# Patient Record
Sex: Female | Born: 1965
Health system: Southern US, Community
[De-identification: ages and names within clinical notes are randomized; demographics above are authoritative.]

## PROBLEM LIST (undated history)

## (undated) DIAGNOSIS — F329 Major depressive disorder, single episode, unspecified: Secondary | ICD-10-CM

## (undated) DIAGNOSIS — T7840XA Allergy, unspecified, initial encounter: Secondary | ICD-10-CM

## (undated) DIAGNOSIS — E2839 Other primary ovarian failure: Secondary | ICD-10-CM

## (undated) DIAGNOSIS — E785 Hyperlipidemia, unspecified: Secondary | ICD-10-CM

## (undated) DIAGNOSIS — M8580 Other specified disorders of bone density and structure, unspecified site: Secondary | ICD-10-CM

## (undated) DIAGNOSIS — U071 COVID-19: Secondary | ICD-10-CM

## (undated) DIAGNOSIS — K635 Polyp of colon: Secondary | ICD-10-CM

## (undated) DIAGNOSIS — E7800 Pure hypercholesterolemia, unspecified: Secondary | ICD-10-CM

## (undated) DIAGNOSIS — N390 Urinary tract infection, site not specified: Secondary | ICD-10-CM

## (undated) DIAGNOSIS — E288 Other ovarian dysfunction: Secondary | ICD-10-CM

## (undated) DIAGNOSIS — F32A Depression, unspecified: Secondary | ICD-10-CM

## (undated) DIAGNOSIS — K219 Gastro-esophageal reflux disease without esophagitis: Secondary | ICD-10-CM

## (undated) DIAGNOSIS — R6882 Decreased libido: Secondary | ICD-10-CM

## (undated) HISTORY — DX: Urinary tract infection, site not specified: N39.0

## (undated) HISTORY — DX: Gastro-esophageal reflux disease without esophagitis: K21.9

## (undated) HISTORY — DX: Allergy, unspecified, initial encounter: T78.40XA

## (undated) HISTORY — DX: Decreased libido: R68.82

## (undated) HISTORY — DX: Other ovarian dysfunction: E28.8

## (undated) HISTORY — DX: Other specified disorders of bone density and structure, unspecified site: M85.80

## (undated) HISTORY — PX: TONSILLECTOMY AND ADENOIDECTOMY: SUR1326

## (undated) HISTORY — DX: COVID-19: U07.1

## (undated) HISTORY — DX: Hyperlipidemia, unspecified: E78.5

## (undated) HISTORY — DX: Other primary ovarian failure: E28.39

## (undated) HISTORY — PX: ABDOMINAL HYSTERECTOMY: SHX81

## (undated) HISTORY — DX: Polyp of colon: K63.5

## (undated) HISTORY — DX: Depression, unspecified: F32.A

## (undated) HISTORY — DX: Major depressive disorder, single episode, unspecified: F32.9

## (undated) HISTORY — DX: Pure hypercholesterolemia, unspecified: E78.00

## (undated) HISTORY — PX: CHOLECYSTECTOMY: SHX55

---

## 1997-10-23 ENCOUNTER — Other Ambulatory Visit: Admission: RE | Admit: 1997-10-23 | Discharge: 1997-10-23 | Payer: Self-pay | Admitting: Obstetrics and Gynecology

## 1998-05-15 ENCOUNTER — Inpatient Hospital Stay (HOSPITAL_COMMUNITY): Admission: AD | Admit: 1998-05-15 | Discharge: 1998-05-18 | Payer: Self-pay | Admitting: Physician Assistant

## 1998-05-21 ENCOUNTER — Encounter (HOSPITAL_COMMUNITY): Admission: RE | Admit: 1998-05-21 | Discharge: 1998-08-19 | Payer: Self-pay | Admitting: Obstetrics and Gynecology

## 1998-06-25 ENCOUNTER — Other Ambulatory Visit: Admission: RE | Admit: 1998-06-25 | Discharge: 1998-06-25 | Payer: Self-pay | Admitting: Obstetrics and Gynecology

## 1999-07-02 ENCOUNTER — Other Ambulatory Visit: Admission: RE | Admit: 1999-07-02 | Discharge: 1999-07-02 | Payer: Self-pay | Admitting: Obstetrics and Gynecology

## 2000-04-16 ENCOUNTER — Other Ambulatory Visit: Admission: RE | Admit: 2000-04-16 | Discharge: 2000-04-16 | Payer: Self-pay | Admitting: Obstetrics and Gynecology

## 2000-11-16 ENCOUNTER — Inpatient Hospital Stay (HOSPITAL_COMMUNITY): Admission: AD | Admit: 2000-11-16 | Discharge: 2000-11-18 | Payer: Self-pay | Admitting: Obstetrics and Gynecology

## 2000-11-20 ENCOUNTER — Inpatient Hospital Stay (HOSPITAL_COMMUNITY): Admission: AD | Admit: 2000-11-20 | Discharge: 2000-11-23 | Payer: Self-pay | Admitting: Obstetrics and Gynecology

## 2000-12-17 ENCOUNTER — Other Ambulatory Visit: Admission: RE | Admit: 2000-12-17 | Discharge: 2000-12-17 | Payer: Self-pay | Admitting: Obstetrics and Gynecology

## 2002-02-10 ENCOUNTER — Other Ambulatory Visit: Admission: RE | Admit: 2002-02-10 | Discharge: 2002-02-10 | Payer: Self-pay | Admitting: Obstetrics and Gynecology

## 2004-01-21 ENCOUNTER — Emergency Department: Payer: Self-pay | Admitting: Emergency Medicine

## 2004-02-05 ENCOUNTER — Ambulatory Visit: Payer: Self-pay | Admitting: Obstetrics and Gynecology

## 2008-11-01 ENCOUNTER — Ambulatory Visit: Payer: Self-pay | Admitting: Sports Medicine

## 2013-06-21 LAB — HM PAP SMEAR: HM Pap smear: NEGATIVE

## 2014-06-27 ENCOUNTER — Other Ambulatory Visit: Payer: Self-pay | Admitting: Obstetrics and Gynecology

## 2014-06-27 DIAGNOSIS — Z01419 Encounter for gynecological examination (general) (routine) without abnormal findings: Secondary | ICD-10-CM

## 2014-07-05 LAB — HM MAMMOGRAPHY

## 2015-06-28 ENCOUNTER — Encounter: Payer: Self-pay | Admitting: Obstetrics and Gynecology

## 2015-07-03 ENCOUNTER — Telehealth: Payer: Self-pay | Admitting: Obstetrics and Gynecology

## 2015-07-03 MED ORDER — ESTRADIOL 0.1 MG/24HR TD PTWK: 0.1000 mg | MEDICATED_PATCH | TRANSDERMAL | Status: DC

## 2015-07-03 NOTE — Telephone Encounter (Signed)
THIS PT MISSED HER AE AND IS OUT OF HER ESTROGEN PATCHES. SHE RESCGEDULED FOR 6/6 CAN YOU FILL THIS RX UNTIL HER APPT [place]

## 2015-07-03 NOTE — Telephone Encounter (Signed)
Pt aware per vm - med erx.  

## 2015-07-11 DIAGNOSIS — L700 Acne vulgaris: Secondary | ICD-10-CM | POA: Diagnosis not present

## 2015-08-01 ENCOUNTER — Other Ambulatory Visit: Payer: Self-pay | Admitting: Obstetrics and Gynecology

## 2015-08-07 ENCOUNTER — Telehealth: Payer: Self-pay | Admitting: *Deleted

## 2015-08-07 ENCOUNTER — Ambulatory Visit (INDEPENDENT_AMBULATORY_CARE_PROVIDER_SITE_OTHER): Payer: BLUE CROSS/BLUE SHIELD | Admitting: Obstetrics and Gynecology

## 2015-08-07 ENCOUNTER — Encounter: Payer: Self-pay | Admitting: Obstetrics and Gynecology

## 2015-08-07 VITALS — BP 121/83 | HR 61 | Ht 62.0 in | Wt 167.5 lb

## 2015-08-07 DIAGNOSIS — Z1239 Encounter for other screening for malignant neoplasm of breast: Secondary | ICD-10-CM | POA: Diagnosis not present

## 2015-08-07 DIAGNOSIS — E288 Other ovarian dysfunction: Secondary | ICD-10-CM

## 2015-08-07 DIAGNOSIS — N809 Endometriosis, unspecified: Secondary | ICD-10-CM | POA: Insufficient documentation

## 2015-08-07 DIAGNOSIS — Z90711 Acquired absence of uterus with remaining cervical stump: Secondary | ICD-10-CM | POA: Insufficient documentation

## 2015-08-07 DIAGNOSIS — Z0000 Encounter for general adult medical examination without abnormal findings: Secondary | ICD-10-CM | POA: Diagnosis not present

## 2015-08-07 DIAGNOSIS — E669 Obesity, unspecified: Secondary | ICD-10-CM

## 2015-08-07 DIAGNOSIS — E2839 Other primary ovarian failure: Secondary | ICD-10-CM | POA: Insufficient documentation

## 2015-08-07 DIAGNOSIS — Z01419 Encounter for gynecological examination (general) (routine) without abnormal findings: Secondary | ICD-10-CM

## 2015-08-07 MED ORDER — ESTRADIOL 0.1 MG/24HR TD PTTW
1.0000 | MEDICATED_PATCH | TRANSDERMAL | Status: DC
Start: 2015-08-07 — End: 2015-08-07

## 2015-08-07 MED ORDER — ESTRADIOL 0.1 MG/24HR TD PTTW: 1.0000 | MEDICATED_PATCH | TRANSDERMAL | Status: DC

## 2015-08-07 MED ORDER — ESTRADIOL 0.1 MG/24HR TD PTWK
MEDICATED_PATCH | TRANSDERMAL | Status: DC
Start: 2015-08-07 — End: 2015-08-07

## 2015-08-07 NOTE — Addendum Note (Signed)
Addended by: Elouise Munroe on: 08/07/2015 04:46 PM   Modules accepted: Orders

## 2015-08-07 NOTE — Telephone Encounter (Signed)
Pt aware meds erx to [facility].

## 2015-08-07 NOTE — Patient Instructions (Signed)
1. No Pap smear. Next Pap smear is in 2018. 2. Mammogram ordered 3. Continue with calcium and vitamin D supplementation 4. Refill Vivelle-Dot 0.1 mg twice a week 5. Continue with healthy eating, exercise, and weight loss 6. Return in 1 year for annual exam

## 2015-08-07 NOTE — Progress Notes (Signed)
GYN ANNUAL PREVENTATIVE CARE ENCOUNTER NOTE  Subjective:       Deanna Young is a 50 y.o. [phone] female here for a routine annual gynecologic exam.  Current complaints: None  History of premature ovarian failure. Status post [facility] for endometriosis. No chronic pelvic pain. Bowel and bladder function are normal. No significant vasomotor symptoms are identified. Patient is taking calcium with vitamin D daily. She also is exercising daily. She feels that her weight with a BMI of 30 minutes due to the way she eats; she is promising to help change dietary habits. Patient desires Vivelle-Dot 0.1 mg twice a week rather than generic patch because the generic patch does not stay on the skin. We will defer Pap until next year because her HPV test in 2015 was negative.   Gynecologic History No LMP recorded. Patient has had a hysterectomy. Contraception: status post hysterectomy Last Pap: 06/21/2013. Results were: Unsatisfactory/negative Last mammogram: 07/05/2014. Results were: Normal  Obstetric History OB History  Gravida Para Term Preterm AB SAB TAB Ectopic Multiple Living  2 2 2       2     # Outcome Date GA Lbr Len/2nd Weight Sex Delivery Anes PTL Lv  2 Term 2002   7 lb (3.175 kg) F Vag-Spont   Y  1 Term 2000   7 lb (3.175 kg) M Vag-Spont   Y      Past Medical History  Diagnosis Date  . Hypercholesteremia   . Depression   . Premature ovarian failure   . Decreased sex drive   . Osteopenia     Past Surgical History  Procedure Laterality Date  . Tonsillectomy and adenoidectomy    . Abdominal hysterectomy      lsh  . Cholecystectomy      Current Outpatient Prescriptions on File Prior to Visit  Medication Sig Dispense Refill  . calcium-vitamin D 250-100 MG-UNIT tablet Take 1 tablet by mouth 2 (two) times daily.    Marland Kitchen estradiol (CLIMARA - DOSED IN MG/24 HR) 0.1 mg/24hr patch PLACE 1 PATCH (0.1 MG TOTAL) ONTO THE SKIN ONCE A WEEK. 4 patch 0   No current facility-administered  medications on file prior to visit.    No Known Allergies  Social History   Social History  . Marital Status: Married    Spouse Name: N/A  . Number of Children: N/A  . Years of Education: N/A   Occupational History  . Not on file.   Social History Main Topics  . Smoking status: Never Smoker   . Smokeless tobacco: Not on file  . Alcohol Use: Yes     Comment: social  . Drug Use: No  . Sexual Activity: Yes    Birth Control/ Protection: Surgical   Other Topics Concern  . Not on file   Social History Narrative    Family History  Problem Relation Age of Onset  . Osteoporosis Mother   . Diabetes Mother   . Heart disease Maternal Grandfather   . Cancer Neg Hx     The following portions of the patient's history were reviewed and updated as appropriate: allergies, current medications, past family history, past medical history, past social history, past surgical history and problem list.  Review of Systems Review of Systems  Constitutional: Negative for chills, weight loss and malaise/fatigue.       No vasomotor symptoms of significance  HENT: Negative.   Respiratory: Negative.   Cardiovascular: Negative.   Gastrointestinal: Negative.  Negative for abdominal pain and  blood in stool.  Genitourinary: Negative.  Negative for dysuria, urgency and frequency.  Musculoskeletal: Negative.   Skin: Negative.  Negative for itching and rash.  Neurological: Negative.   Endo/Heme/Allergies: Negative.   Psychiatric/Behavioral: Negative.      Objective:   BP 121/83 mmHg  Pulse 61  Ht 5\' 2"  (1.575 m)  Wt 167 lb 8 oz (75.978 kg)  BMI 30.63 kg/m2 Physical Exam  Constitutional: She is oriented to person, place, and time. She appears well-developed and well-nourished.  HENT:  Head: Normocephalic and atraumatic.  Eyes: Conjunctivae and EOM are normal.  Neck: Normal range of motion. Neck supple. No thyromegaly present.  Cardiovascular: Normal rate, regular rhythm and normal heart  sounds.   No murmur heard. Pulmonary/Chest: Effort normal and breath sounds normal.  Abdominal: Soft. Bowel sounds are normal. She exhibits no distension and no mass. There is no tenderness.  Genitourinary:  External genitalia-normal BUS normal Vagina-normal estrogen effect; good vault support Cervix-normal; no lesions; no cervical motion tenderness Uterus-surgically absent Adnexa-nonpalpable and nontender Rectovaginal-normal external exam; normal sphincter tone; No rectal masses  Musculoskeletal: Normal range of motion. She exhibits no edema or tenderness.  Lymphadenopathy:    She has no cervical adenopathy.  Neurological: She is alert and oriented to person, place, and time.  Skin: No rash noted. No erythema.  Psychiatric: She has a normal mood and affect.    Assessment:   Annual gynecologic examination 50 y.o. Contraception: status post hysterectomy LSH Obesity 1 History of endometriosis, asymptomatic History of premature ovarian failure, asymptomatic on ERT; desires Vivelle-Dot patches not the estradiol generic patch  Plan:  Pap: Not needed Mammogram: Ordered Labs: Primary care Routine preventative health maintenance measures emphasized: Diet/Weight control, Tobacco Cessation and Alcohol/Drug use Refill Vivelle-Dot 0.1 mg patch twice a week Continue calcium and vitamin D supplementation Return to [place], [place]   Note: This dictation was prepared with Dragon dictation along with smaller phrase technology. Any transcriptional errors that result from this process are unintentional.

## 2015-08-07 NOTE — Addendum Note (Signed)
Addended by: Elouise Munroe on: 08/07/2015 04:30 PM   Modules accepted: Orders

## 2015-08-07 NOTE — Telephone Encounter (Signed)
Patient called states that Dr. Tennis Must gave her a written RX this morning and it needs to be faxed to mail order.  The RX is for Vivelle. Her mail order pharmacy is [facility]  Fax # is [phone]. Thanks

## 2015-08-16 ENCOUNTER — Ambulatory Visit: Payer: Self-pay | Attending: Obstetrics and Gynecology

## 2015-08-30 DIAGNOSIS — D225 Melanocytic nevi of trunk: Secondary | ICD-10-CM | POA: Diagnosis not present

## 2015-09-11 ENCOUNTER — Other Ambulatory Visit: Payer: Self-pay | Admitting: Obstetrics and Gynecology

## 2015-09-11 ENCOUNTER — Ambulatory Visit
Admission: RE | Admit: 2015-09-11 | Discharge: 2015-09-11 | Disposition: A | Discharge status: Home / Self Care | Payer: BLUE CROSS/BLUE SHIELD | Source: Ambulatory Visit | Attending: Obstetrics and Gynecology | Admitting: Obstetrics and Gynecology

## 2015-09-11 DIAGNOSIS — Z1239 Encounter for other screening for malignant neoplasm of breast: Secondary | ICD-10-CM

## 2015-09-11 DIAGNOSIS — Z1231 Encounter for screening mammogram for malignant neoplasm of breast: Secondary | ICD-10-CM | POA: Diagnosis not present

## 2015-11-08 DIAGNOSIS — N951 Menopausal and female climacteric states: Secondary | ICD-10-CM | POA: Diagnosis not present

## 2015-11-08 DIAGNOSIS — E782 Mixed hyperlipidemia: Secondary | ICD-10-CM | POA: Diagnosis not present

## 2015-11-08 DIAGNOSIS — E559 Vitamin D deficiency, unspecified: Secondary | ICD-10-CM | POA: Diagnosis not present

## 2015-11-08 DIAGNOSIS — Z6830 Body mass index (BMI) 30.0-30.9, adult: Secondary | ICD-10-CM | POA: Diagnosis not present

## 2015-11-08 DIAGNOSIS — Z0000 Encounter for general adult medical examination without abnormal findings: Secondary | ICD-10-CM | POA: Diagnosis not present

## 2015-12-11 DIAGNOSIS — M9902 Segmental and somatic dysfunction of thoracic region: Secondary | ICD-10-CM | POA: Diagnosis not present

## 2015-12-11 DIAGNOSIS — M531 Cervicobrachial syndrome: Secondary | ICD-10-CM | POA: Diagnosis not present

## 2015-12-11 DIAGNOSIS — M5386 Other specified dorsopathies, lumbar region: Secondary | ICD-10-CM | POA: Diagnosis not present

## 2016-01-21 DIAGNOSIS — M9901 Segmental and somatic dysfunction of cervical region: Secondary | ICD-10-CM | POA: Diagnosis not present

## 2016-01-21 DIAGNOSIS — M5386 Other specified dorsopathies, lumbar region: Secondary | ICD-10-CM | POA: Diagnosis not present

## 2016-01-21 DIAGNOSIS — M531 Cervicobrachial syndrome: Secondary | ICD-10-CM | POA: Diagnosis not present

## 2016-01-21 DIAGNOSIS — M9902 Segmental and somatic dysfunction of thoracic region: Secondary | ICD-10-CM | POA: Diagnosis not present

## 2016-03-04 DIAGNOSIS — M9901 Segmental and somatic dysfunction of cervical region: Secondary | ICD-10-CM | POA: Diagnosis not present

## 2016-03-04 DIAGNOSIS — M531 Cervicobrachial syndrome: Secondary | ICD-10-CM | POA: Diagnosis not present

## 2016-03-04 DIAGNOSIS — M436 Torticollis: Secondary | ICD-10-CM | POA: Diagnosis not present

## 2016-03-04 DIAGNOSIS — Z23 Encounter for immunization: Secondary | ICD-10-CM | POA: Diagnosis not present

## 2016-03-17 DIAGNOSIS — M531 Cervicobrachial syndrome: Secondary | ICD-10-CM | POA: Diagnosis not present

## 2016-03-17 DIAGNOSIS — M436 Torticollis: Secondary | ICD-10-CM | POA: Diagnosis not present

## 2016-03-17 DIAGNOSIS — M9901 Segmental and somatic dysfunction of cervical region: Secondary | ICD-10-CM | POA: Diagnosis not present

## 2016-05-27 DIAGNOSIS — M9901 Segmental and somatic dysfunction of cervical region: Secondary | ICD-10-CM | POA: Diagnosis not present

## 2016-05-27 DIAGNOSIS — M9903 Segmental and somatic dysfunction of lumbar region: Secondary | ICD-10-CM | POA: Diagnosis not present

## 2016-05-27 DIAGNOSIS — M531 Cervicobrachial syndrome: Secondary | ICD-10-CM | POA: Diagnosis not present

## 2016-05-27 DIAGNOSIS — M5386 Other specified dorsopathies, lumbar region: Secondary | ICD-10-CM | POA: Diagnosis not present

## 2016-06-20 DIAGNOSIS — G5721 Lesion of femoral nerve, right lower limb: Secondary | ICD-10-CM | POA: Diagnosis not present

## 2016-06-20 DIAGNOSIS — S39012A Strain of muscle, fascia and tendon of lower back, initial encounter: Secondary | ICD-10-CM | POA: Diagnosis not present

## 2016-06-20 DIAGNOSIS — M9903 Segmental and somatic dysfunction of lumbar region: Secondary | ICD-10-CM | POA: Diagnosis not present

## 2016-06-23 DIAGNOSIS — M9903 Segmental and somatic dysfunction of lumbar region: Secondary | ICD-10-CM | POA: Diagnosis not present

## 2016-06-23 DIAGNOSIS — S39012A Strain of muscle, fascia and tendon of lower back, initial encounter: Secondary | ICD-10-CM | POA: Diagnosis not present

## 2016-06-23 DIAGNOSIS — G5721 Lesion of femoral nerve, right lower limb: Secondary | ICD-10-CM | POA: Diagnosis not present

## 2016-06-24 DIAGNOSIS — M9903 Segmental and somatic dysfunction of lumbar region: Secondary | ICD-10-CM | POA: Diagnosis not present

## 2016-06-24 DIAGNOSIS — G5721 Lesion of femoral nerve, right lower limb: Secondary | ICD-10-CM | POA: Diagnosis not present

## 2016-06-24 DIAGNOSIS — S39012A Strain of muscle, fascia and tendon of lower back, initial encounter: Secondary | ICD-10-CM | POA: Diagnosis not present

## 2016-06-27 DIAGNOSIS — M9903 Segmental and somatic dysfunction of lumbar region: Secondary | ICD-10-CM | POA: Diagnosis not present

## 2016-06-27 DIAGNOSIS — S39012A Strain of muscle, fascia and tendon of lower back, initial encounter: Secondary | ICD-10-CM | POA: Diagnosis not present

## 2016-06-27 DIAGNOSIS — G5721 Lesion of femoral nerve, right lower limb: Secondary | ICD-10-CM | POA: Diagnosis not present

## 2016-07-24 ENCOUNTER — Telehealth: Payer: Self-pay | Admitting: Obstetrics and Gynecology

## 2016-07-24 NOTE — Telephone Encounter (Signed)
VM from [place] concerning the refill request for estradiol patch  Please call [phone] or fax/escribe to [phone]

## 2016-07-25 NOTE — Telephone Encounter (Signed)
Pharmacy aware of refill request.

## 2016-08-04 NOTE — Progress Notes (Signed)
GYN ANNUAL PREVENTATIVE CARE ENCOUNTER NOTE  Subjective:       Deanna Young is a 51 y.o. [phone] female here for a routine annual gynecologic exam.  Current complaints: None 1. none  History of premature ovarian failure. Status post [facility] for endometriosis. No chronic pelvic pain. Bowel and bladder function are normal. No significant vasomotor symptoms are identified.She remains on Vivelle-Dot 0.1 mg twice weekly Patient is taking calcium with vitamin D daily. She also is exercising daily.  The patient's primary care doctor is retiring: She will use me as  her primary care physician at this time.   Gynecologic History No LMP recorded. Patient has had a hysterectomy. Contraception: status post hysterectomy Last Pap: 06/21/2013. Results were: Unsatisfactory/negative Last mammogram 09/2015 birad 1  Results were: Normal  Obstetric History OB History  Gravida Para Term Preterm AB Living  2 2 2     2   SAB TAB Ectopic Multiple Live Births          2    # Outcome Date GA Lbr Len/2nd Weight Sex Delivery Anes PTL Lv  2 Term 2002   7 lb (3.175 kg) F Vag-Spont   LIV  1 Term 2000   7 lb (3.175 kg) M Vag-Spont   LIV      Past Medical History:  Diagnosis Date  . Decreased sex drive   . Depression   . Hypercholesteremia   . Osteopenia   . Premature ovarian failure     Past Surgical History:  Procedure Laterality Date  . ABDOMINAL HYSTERECTOMY     lsh  . CHOLECYSTECTOMY    . TONSILLECTOMY AND ADENOIDECTOMY      Current Outpatient Prescriptions on File Prior to Visit  Medication Sig Dispense Refill  . ACZONE 7.5 % GEL Aply TO FACE EVERY MORNING  0  . calcium-vitamin D 250-100 MG-UNIT tablet Take 1 tablet by mouth 2 (two) times daily.    Marland Kitchen estradiol (VIVELLE-DOT) 0.1 MG/24HR patch Place 1 patch (0.1 mg total) onto the skin 2 (two) times a week. 24 patch 3  . hydrochlorothiazide (HYDRODIURIL) 25 MG tablet   4  . VELTIN gel APPLY TO THE AFFECTED AREA AT BEDTIME  0   No current  facility-administered medications on file prior to visit.     No Known Allergies  Social History   Social History  . Marital status: Married    Spouse name: N/A  . Number of children: N/A  . Years of education: N/A   Occupational History  . Not on file.   Social History Main Topics  . Smoking status: Never Smoker  . Smokeless tobacco: Not on file  . Alcohol use Yes     Comment: social  . Drug use: No  . Sexual activity: Yes    Birth control/ protection: Surgical   Other Topics Concern  . Not on file   Social History Narrative  . No narrative on file    Family History  Problem Relation Age of Onset  . Osteoporosis Mother   . Diabetes Mother   . Heart disease Maternal Grandfather   . Cancer Neg Hx     The following portions of the patient's history were reviewed and updated as appropriate: allergies, current medications, past family history, past medical history, past social history, past surgical history and problem list.  Review of Systems Review of Systems  Constitutional: Positive for weight loss. Negative for chills and malaise/fatigue.  HENT: Negative.   Eyes: Negative.   Respiratory:  Negative.   Cardiovascular: Negative.   Gastrointestinal: Negative.  Negative for abdominal pain and blood in stool.  Genitourinary: Negative.  Negative for dysuria, frequency and urgency.  Musculoskeletal: Negative.   Skin: Negative.  Negative for itching and rash.  Neurological: Negative.   Endo/Heme/Allergies: Negative.   Psychiatric/Behavioral: Negative.      Objective:  BP 107/71   Pulse 63   Ht 5\' 2"  (1.575 m)   Wt 163 lb 6.4 oz (74.1 kg)   BMI 29.89 kg/m   Physical Exam  Constitutional: She is oriented to person, place, and time. She appears well-developed and well-nourished.  HENT:  Head: Normocephalic and atraumatic.  Eyes: Conjunctivae and EOM are normal. No scleral icterus.  Neck: Normal range of motion. Neck supple. No thyromegaly present.   Cardiovascular: Normal rate, regular rhythm and normal heart sounds.   No murmur heard. Pulmonary/Chest: Effort normal and breath sounds normal.  Abdominal: Soft. Bowel sounds are normal. She exhibits no distension and no mass. There is no tenderness.  Genitourinary:  Genitourinary Comments: External genitalia-normal BUS normal Vagina-normal estrogen effect; good vault support Cervix-normal; no lesions; no cervical motion tenderness Uterus-surgically absent Adnexa-nonpalpable and nontender Rectovaginal-normal external exam; normal sphincter tone; No rectal masses  Musculoskeletal: Normal range of motion. She exhibits no edema or tenderness.  Lymphadenopathy:    She has no cervical adenopathy.  Neurological: She is alert and oriented to person, place, and time.  Skin: No rash noted. No erythema.  Psychiatric: She has a normal mood and affect.    Assessment:   Annual gynecologic examination 51 y.o. Contraception: status post hysterectomy LSH Obesity 1 History of endometriosis, asymptomatic History of premature ovarian failure, asymptomatic on ERT  Plan:  Pap: pap w/hpv Mammogram: Ordered Labs: Lipid 1, FBS, TSH and Hemoglobin A1C Routine preventative health maintenance measures emphasized: Diet/Weight control, Tobacco Cessation and Alcohol/Drug use Refill Vivelle-Dot 0.1 mg patch twice a week Continue calcium and vitamin D supplementation Return to [place], CMA  Brayton Mars, MD   Note: This dictation was prepared with Dragon dictation along with smaller phrase technology. Any transcriptional errors that result from this process are unintentional.

## 2016-08-07 ENCOUNTER — Encounter: Payer: BLUE CROSS/BLUE SHIELD | Admitting: Obstetrics and Gynecology

## 2016-08-07 ENCOUNTER — Ambulatory Visit (INDEPENDENT_AMBULATORY_CARE_PROVIDER_SITE_OTHER): Payer: BLUE CROSS/BLUE SHIELD | Admitting: Obstetrics and Gynecology

## 2016-08-07 ENCOUNTER — Encounter: Payer: Self-pay | Admitting: Obstetrics and Gynecology

## 2016-08-07 VITALS — BP 107/71 | HR 63 | Ht 62.0 in | Wt 163.4 lb

## 2016-08-07 DIAGNOSIS — Z01419 Encounter for gynecological examination (general) (routine) without abnormal findings: Secondary | ICD-10-CM

## 2016-08-07 DIAGNOSIS — E288 Other ovarian dysfunction: Secondary | ICD-10-CM | POA: Diagnosis not present

## 2016-08-07 DIAGNOSIS — Z1231 Encounter for screening mammogram for malignant neoplasm of breast: Secondary | ICD-10-CM

## 2016-08-07 DIAGNOSIS — N809 Endometriosis, unspecified: Secondary | ICD-10-CM | POA: Diagnosis not present

## 2016-08-07 DIAGNOSIS — Z1239 Encounter for other screening for malignant neoplasm of breast: Secondary | ICD-10-CM

## 2016-08-07 DIAGNOSIS — E2839 Other primary ovarian failure: Secondary | ICD-10-CM

## 2016-08-07 DIAGNOSIS — E669 Obesity, unspecified: Secondary | ICD-10-CM | POA: Diagnosis not present

## 2016-08-07 DIAGNOSIS — Z90711 Acquired absence of uterus with remaining cervical stump: Secondary | ICD-10-CM

## 2016-08-07 MED ORDER — ESTRADIOL 0.1 MG/24HR TD PTTW: 1.0000 | MEDICATED_PATCH | TRANSDERMAL | 3 refills | Status: DC

## 2016-08-07 NOTE — Patient Instructions (Signed)
1. Pap smear is done 2. Mammogram is ordered 3. Screening labs are to be obtained through primary care 4. Vivelle-Dot 0.1 mg twice weekly is refilled 5. Continue with calcium and vitamin D supplementation daily 6. Continue with healthy eating and exercise 7. Return in 1 year for annual exam  Health Maintenance, Female Adopting a healthy lifestyle and getting preventive care can go a long way to promote health and wellness. Talk with your health care provider about what schedule of regular examinations is right for you. This is a good chance for you to check in with your provider about disease prevention and staying healthy. In between checkups, there are plenty of things you can do on your own. Experts have done a lot of research about which lifestyle changes and preventive measures are most likely to keep you healthy. Ask your health care provider for more information. Weight and diet Eat a healthy diet  Be sure to include plenty of vegetables, fruits, low-fat dairy products, and lean protein.  Do not eat a lot of foods high in solid fats, added sugars, or salt.  Get regular exercise. This is one of the most important things you can do for your health. ? Most adults should exercise for at least 150 minutes each week. The exercise should increase your heart rate and make you sweat (moderate-intensity exercise). ? Most adults should also do strengthening exercises at least twice a week. This is in addition to the moderate-intensity exercise.  Maintain a healthy weight  Body mass index (BMI) is a measurement that can be used to identify possible weight problems. It estimates body fat based on height and weight. Your health care provider can help determine your BMI and help you achieve or maintain a healthy weight.  For females 61 years of age and older: ? A BMI below 18.5 is considered underweight. ? A BMI of 18.5 to 24.9 is normal. ? A BMI of 25 to 29.9 is considered overweight. ? A BMI  of 30 and above is considered obese.  Watch levels of cholesterol and blood lipids  You should start having your blood tested for lipids and cholesterol at 49 years of age, then have this test every 5 years.  You may need to have your cholesterol levels checked more often if: ? Your lipid or cholesterol levels are high. ? You are older than 49 years of age. ? You are at high risk for heart disease.  Cancer screening Lung Cancer  Lung cancer screening is recommended for adults 29-36 years old who are at high risk for lung cancer because of a history of smoking.  A yearly low-dose CT scan of the lungs is recommended for people who: ? Currently smoke. ? Have quit within the past 15 years. ? Have at least a 30-pack-year history of smoking. A pack year is smoking an average of one pack of cigarettes a day for 1 year.  Yearly screening should continue until it has been 15 years since you quit.  Yearly screening should stop if you develop a health problem that would prevent you from having lung cancer treatment.  Breast Cancer  Practice breast self-awareness. This means understanding how your breasts normally appear and feel.  It also means doing regular breast self-exams. Let your health care provider know about any changes, no matter how small.  If you are in your 20s or 30s, you should have a clinical breast exam (CBE) by a health care provider every 1-3 years as part  of a regular health exam.  If you are 30 or older, have a CBE every year. Also consider having a breast X-ray (mammogram) every year.  If you have a family history of breast cancer, talk to your health care provider about genetic screening.  If you are at high risk for breast cancer, talk to your health care provider about having an MRI and a mammogram every year.  Breast cancer gene (BRCA) assessment is recommended for women who have family members with BRCA-related cancers. BRCA-related cancers  include: ? Breast. ? Ovarian. ? Tubal. ? Peritoneal cancers.  Results of the assessment will determine the need for genetic counseling and BRCA1 and BRCA2 testing.  Cervical Cancer Your health care provider may recommend that you be screened regularly for cancer of the pelvic organs (ovaries, uterus, and vagina). This screening involves a pelvic examination, including checking for microscopic changes to the surface of your cervix (Pap test). You may be encouraged to have this screening done every 3 years, beginning at age 70.  For women ages 17-65, health care providers may recommend pelvic exams and Pap testing every 3 years, or they may recommend the Pap and pelvic exam, combined with testing for human papilloma virus (HPV), every 5 years. Some types of HPV increase your risk of cervical cancer. Testing for HPV may also be done on women of any age with unclear Pap test results.  Other health care providers may not recommend any screening for nonpregnant women who are considered low risk for pelvic cancer and who do not have symptoms. Ask your health care provider if a screening pelvic exam is right for you.  If you have had past treatment for cervical cancer or a condition that could lead to cancer, you need Pap tests and screening for cancer for at least 20 years after your treatment. If Pap tests have been discontinued, your risk factors (such as having a new sexual partner) need to be reassessed to determine if screening should resume. Some women have medical problems that increase the chance of getting cervical cancer. In these cases, your health care provider may recommend more frequent screening and Pap tests.  Colorectal Cancer  This type of cancer can be detected and often prevented.  Routine colorectal cancer screening usually begins at 49 years of age and continues through 49 years of age.  Your health care provider may recommend screening at an earlier age if you have risk factors  for colon cancer.  Your health care provider may also recommend using home test kits to check for hidden blood in the stool.  A small camera at the end of a tube can be used to examine your colon directly (sigmoidoscopy or colonoscopy). This is done to check for the earliest forms of colorectal cancer.  Routine screening usually begins at age 85.  Direct examination of the colon should be repeated every 5-10 years through 49 years of age. However, you may need to be screened more often if early forms of precancerous polyps or small growths are found.  Skin Cancer  Check your skin from head to toe regularly.  Tell your health care provider about any new moles or changes in moles, especially if there is a change in a mole's shape or color.  Also tell your health care provider if you have a mole that is larger than the size of a pencil eraser.  Always use sunscreen. Apply sunscreen liberally and repeatedly throughout the day.  Protect yourself by wearing long  sleeves, pants, a wide-brimmed hat, and sunglasses whenever you are outside.  Heart disease, diabetes, and high blood pressure  High blood pressure causes heart disease and increases the risk of stroke. High blood pressure is more likely to develop in: ? People who have blood pressure in the high end of the normal range (130-139/85-89 mm Hg). ? People who are overweight or obese. ? People who are African American.  If you are 93-79 years of age, have your blood pressure checked every 3-5 years. If you are 85 years of age or older, have your blood pressure checked every year. You should have your blood pressure measured twice-once when you are at a hospital or clinic, and once when you are not at a hospital or clinic. Record the average of the two measurements. To check your blood pressure when you are not at a hospital or clinic, you can use: ? An automated blood pressure machine at a pharmacy. ? A home blood pressure monitor.  If  you are between 74 years and 26 years old, ask your health care provider if you should take aspirin to prevent strokes.  Have regular diabetes screenings. This involves taking a blood sample to check your fasting blood sugar level. ? If you are at a normal weight and have a low risk for diabetes, have this test once every three years after 49 years of age. ? If you are overweight and have a high risk for diabetes, consider being tested at a younger age or more often. Preventing infection Hepatitis B  If you have a higher risk for hepatitis B, you should be screened for this virus. You are considered at high risk for hepatitis B if: ? You were born in a country where hepatitis B is common. Ask your health care provider which countries are considered high risk. ? Your parents were born in a high-risk country, and you have not been immunized against hepatitis B (hepatitis B vaccine). ? You have HIV or AIDS. ? You use needles to inject street drugs. ? You live with someone who has hepatitis B. ? You have had sex with someone who has hepatitis B. ? You get hemodialysis treatment. ? You take certain medicines for conditions, including cancer, organ transplantation, and autoimmune conditions.  Hepatitis C  Blood testing is recommended for: ? Everyone born from [birth year] through 1963. ? Anyone with known risk factors for hepatitis C.  Sexually transmitted infections (STIs)  You should be screened for sexually transmitted infections (STIs) including gonorrhea and chlamydia if: ? You are sexually active and are younger than 49 years of age. ? You are older than 49 years of age and your health care provider tells you that you are at risk for this type of infection. ? Your sexual activity has changed since you were last screened and you are at an increased risk for chlamydia or gonorrhea. Ask your health care provider if you are at risk.  If you do not have HIV, but are at risk, it may be recommended  that you take a prescription medicine daily to prevent HIV infection. This is called pre-exposure prophylaxis (PrEP). You are considered at risk if: ? You are sexually active and do not regularly use condoms or know the HIV status of your partner(s). ? You take drugs by injection. ? You are sexually active with a partner who has HIV.  Talk with your health care provider about whether you are at high risk of being infected with HIV. If  you choose to begin PrEP, you should first be tested for HIV. You should then be tested every 3 months for as long as you are taking PrEP. Pregnancy  If you are premenopausal and you may become pregnant, ask your health care provider about preconception counseling.  If you may become pregnant, take 400 to 800 micrograms (mcg) of folic acid every day.  If you want to prevent pregnancy, talk to your health care provider about birth control (contraception). Osteoporosis and menopause  Osteoporosis is a disease in which the bones lose minerals and strength with aging. This can result in serious bone fractures. Your risk for osteoporosis can be identified using a bone density scan.  If you are 51 years of age or older, or if you are at risk for osteoporosis and fractures, ask your health care provider if you should be screened.  Ask your health care provider whether you should take a calcium or vitamin D supplement to lower your risk for osteoporosis.  Menopause may have certain physical symptoms and risks.  Hormone replacement therapy may reduce some of these symptoms and risks. Talk to your health care provider about whether hormone replacement therapy is right for you. Follow these instructions at home:  Schedule regular health, dental, and eye exams.  Stay current with your immunizations.  Do not use any tobacco products including cigarettes, chewing tobacco, or electronic cigarettes.  If you are pregnant, do not drink alcohol.  If you are  breastfeeding, limit how much and how often you drink alcohol.  Limit alcohol intake to no more than 1 drink per day for nonpregnant women. One drink equals 12 ounces of beer, 5 ounces of wine, or 1 ounces of hard liquor.  Do not use street drugs.  Do not share needles.  Ask your health care provider for help if you need support or information about quitting drugs.  Tell your health care provider if you often feel depressed.  Tell your health care provider if you have ever been abused or do not feel safe at home. This information is not intended to replace advice given to you by your health care provider. Make sure you discuss any questions you have with your health care provider. Document Released: 09/02/2010 Document Revised: 07/26/2015 Document Reviewed: 11/21/2014 Elsevier Interactive Patient Education  [employer].

## 2016-08-08 LAB — LIPID PANEL
CHOLESTEROL TOTAL: 220 mg/dL — AB (ref 100–199)
Chol/HDL Ratio: 3 ratio (ref 0.0–4.4)
HDL: 74 mg/dL (ref >39)
LDL CALC: 131 mg/dL — AB (ref 0–99)
TRIGLYCERIDES: 73 mg/dL (ref 0–149)
VLDL CHOLESTEROL CAL: 15 mg/dL (ref 5–40)

## 2016-08-08 LAB — HEMOGLOBIN A1C
ESTIMATED AVERAGE GLUCOSE: 91 mg/dL
Hgb A1c MFr Bld: 4.8 % (ref 4.8–5.6)

## 2016-08-08 LAB — TSH: TSH: 1.77 u[IU]/mL (ref 0.450–4.500)

## 2016-08-08 LAB — GLUCOSE, FASTING: Glucose, Plasma: 83 mg/dL (ref 65–99)

## 2016-08-09 LAB — PAP IG AND HPV HIGH-RISK
HPV, high-risk: NEGATIVE
PAP Smear Comment: 0

## 2016-08-29 DIAGNOSIS — D225 Melanocytic nevi of trunk: Secondary | ICD-10-CM | POA: Diagnosis not present

## 2016-10-07 ENCOUNTER — Ambulatory Visit
Admission: RE | Admit: 2016-10-07 | Discharge: 2016-10-07 | Disposition: A | Discharge status: Home / Self Care | Payer: BLUE CROSS/BLUE SHIELD | Source: Ambulatory Visit | Attending: Obstetrics and Gynecology | Admitting: Obstetrics and Gynecology

## 2016-10-07 DIAGNOSIS — Z1231 Encounter for screening mammogram for malignant neoplasm of breast: Secondary | ICD-10-CM | POA: Diagnosis not present

## 2016-10-07 DIAGNOSIS — Z1239 Encounter for other screening for malignant neoplasm of breast: Secondary | ICD-10-CM

## 2017-01-14 DIAGNOSIS — Z23 Encounter for immunization: Secondary | ICD-10-CM | POA: Diagnosis not present

## 2017-03-03 DIAGNOSIS — K5790 Diverticulosis of intestine, part unspecified, without perforation or abscess without bleeding: Secondary | ICD-10-CM

## 2017-03-03 HISTORY — DX: Diverticulosis of intestine, part unspecified, without perforation or abscess without bleeding: K57.90

## 2017-03-18 DIAGNOSIS — M9902 Segmental and somatic dysfunction of thoracic region: Secondary | ICD-10-CM | POA: Diagnosis not present

## 2017-03-18 DIAGNOSIS — M9901 Segmental and somatic dysfunction of cervical region: Secondary | ICD-10-CM | POA: Diagnosis not present

## 2017-03-18 DIAGNOSIS — M531 Cervicobrachial syndrome: Secondary | ICD-10-CM | POA: Diagnosis not present

## 2017-03-27 DIAGNOSIS — M9903 Segmental and somatic dysfunction of lumbar region: Secondary | ICD-10-CM | POA: Diagnosis not present

## 2017-03-27 DIAGNOSIS — M9902 Segmental and somatic dysfunction of thoracic region: Secondary | ICD-10-CM | POA: Diagnosis not present

## 2017-03-27 DIAGNOSIS — M531 Cervicobrachial syndrome: Secondary | ICD-10-CM | POA: Diagnosis not present

## 2017-03-27 DIAGNOSIS — M9901 Segmental and somatic dysfunction of cervical region: Secondary | ICD-10-CM | POA: Diagnosis not present

## 2017-05-27 DIAGNOSIS — M9902 Segmental and somatic dysfunction of thoracic region: Secondary | ICD-10-CM | POA: Diagnosis not present

## 2017-05-27 DIAGNOSIS — M9903 Segmental and somatic dysfunction of lumbar region: Secondary | ICD-10-CM | POA: Diagnosis not present

## 2017-05-27 DIAGNOSIS — M9901 Segmental and somatic dysfunction of cervical region: Secondary | ICD-10-CM | POA: Diagnosis not present

## 2017-08-10 NOTE — Progress Notes (Signed)
GYN ANNUAL PREVENTATIVE CARE ENCOUNTER NOTE  Subjective:       Deanna Young is a 52 y.o. [phone] female here for a routine annual gynecologic exam.  Current complaints: None 1. none  History of premature ovarian failure. Status post [facility] for endometriosis. No chronic pelvic pain. Bowel and bladder function are normal. No significant vasomotor symptoms are identified except for occasional hot flash..She remains on Vivelle-Dot 0.1 mg twice weekly.  Patient is taking calcium with vitamin D daily. She also is exercising daily-playing tennis and working The patient's primary care doctor has retired.  Gynecologic History No LMP recorded. Patient has had a hysterectomy. Contraception: status post hysterectomy Last Pap: 08/07/2016 neg/neg. Results were: Unsatisfactory/negative Last mammogram 10/2016  birad 1  Results were: Normal  Obstetric History OB History  Gravida Para Term Preterm AB Living  2 2 2     2   SAB TAB Ectopic Multiple Live Births          2    # Outcome Date GA Lbr Len/2nd Weight Sex Delivery Anes PTL Lv  2 Term 2002   7 lb (3.175 kg) F Vag-Spont   LIV  1 Term 2000   7 lb (3.175 kg) M Vag-Spont   LIV    Past Medical History:  Diagnosis Date  . Decreased sex drive   . Depression   . Hypercholesteremia   . Osteopenia   . Premature ovarian failure     Past Surgical History:  Procedure Laterality Date  . ABDOMINAL HYSTERECTOMY     lsh  . CHOLECYSTECTOMY    . TONSILLECTOMY AND ADENOIDECTOMY      Current Outpatient Medications on File Prior to Visit  Medication Sig Dispense Refill  . calcium-vitamin D 250-100 MG-UNIT tablet Take 1 tablet by mouth 2 (two) times daily.    Marland Kitchen estradiol (VIVELLE-DOT) 0.1 MG/24HR patch Place 1 patch (0.1 mg total) onto the skin 2 (two) times a week. 24 patch 3  . hydrochlorothiazide (HYDRODIURIL) 25 MG tablet   4   No current facility-administered medications on file prior to visit.     No Known Allergies  Social History    Socioeconomic History  . Marital status: Married    Spouse name: Not on file  . Number of children: Not on file  . Years of education: Not on file  . Highest education level: Not on file  Occupational History  . Not on file  Social Needs  . Financial resource strain: Not on file  . Food insecurity:    Worry: Not on file    Inability: Not on file  . Transportation needs:    Medical: Not on file    Non-medical: Not on file  Tobacco Use  . Smoking status: Never Smoker  . Smokeless tobacco: Never Used  Substance and Sexual Activity  . Alcohol use: Yes    Comment: social  . Drug use: No  . Sexual activity: Yes    Birth control/protection: Surgical  Lifestyle  . Physical activity:    Days per week: Not on file    Minutes per session: Not on file  . Stress: Not on file  Relationships  . Social connections:    Talks on phone: Not on file    Gets together: Not on file    Attends religious service: Not on file    Active member of club or organization: Not on file    Attends meetings of clubs or organizations: Not on file    Relationship  status: Not on file  . Intimate partner violence:    Fear of current or ex partner: Not on file    Emotionally abused: Not on file    Physically abused: Not on file    Forced sexual activity: Not on file  Other Topics Concern  . Not on file  Social History Narrative  . Not on file    Family History  Problem Relation Age of Onset  . Osteoporosis Mother   . Diabetes Mother   . Heart disease Maternal Grandfather   . Cancer Neg Hx     The following portions of the patient's history were reviewed and updated as appropriate: allergies, current medications, past family history, past medical history, past social history, past surgical history and problem list.  Review of Systems Review of Systems  Constitutional:       Occasional hot flash  HENT: Negative.   Eyes: Negative.   Respiratory: Negative.   Cardiovascular: Negative.    Gastrointestinal: Negative.   Genitourinary: Negative.        No incontinence  Musculoskeletal: Negative.   Neurological: Negative.   Endo/Heme/Allergies: Negative.   Psychiatric/Behavioral: Negative.       Objective:  BP 122/71   Pulse 66   Ht 5\' 2"  (1.575 m)   Wt 165 lb 8 oz (75.1 kg)   BMI 30.27 kg/m  Pleasant well-appearing female no acute distress.  Alert and oriented. HEENT exam: Normocephalic atraumatic.  Oropharynx clear. Neck: Supple without thyromegaly adenopathy Lungs: Clear Heart: Regular rate and rhythm without murmur Back: No CVA tenderness or spinal tenderness Abdomen: Soft, nontender; no organomegaly Pelvic exam: Surgeon today-normal BUS-normal Vagina-good estrogen effect; no significant discharge Cervix-HEENT exam: Normocephalic atraumatic Uterus-surgically absent Adnexa-nonpalpable nontender Rectovaginal-normal external exam; normal sphincter tone; no rectal masses Extremities: Warm and dry without edema Skin: No rash or atypical lesions   Assessment:   Annual gynecologic examination 52 y.o. Contraception: status post hysterectomy LSH Obesity 1 BMI 30.27 History of endometriosis, asymptomatic History of premature ovarian failure, asymptomatic on ERT  Plan:  Pap: Due 2021 Mammogram: Ordered Labs: Lipid 1, FBS, TSH and Hemoglobin A1C Routine preventative health maintenance measures emphasized: Diet/Weight control, Tobacco Cessation and Alcohol/Drug use Refill Vivelle-Dot 0.1 mg patch twice a week Continue calcium and vitamin D supplementation Return to [place], CMA  Brayton Mars, MD   Note: This dictation was prepared with Dragon dictation along with smaller phrase technology. Any transcriptional errors that result from this process are unintentional.

## 2017-08-19 ENCOUNTER — Encounter: Payer: Self-pay | Admitting: Obstetrics and Gynecology

## 2017-08-19 ENCOUNTER — Ambulatory Visit (INDEPENDENT_AMBULATORY_CARE_PROVIDER_SITE_OTHER): Payer: BLUE CROSS/BLUE SHIELD | Admitting: Obstetrics and Gynecology

## 2017-08-19 VITALS — BP 122/71 | HR 66 | Ht 62.0 in | Wt 165.5 lb

## 2017-08-19 DIAGNOSIS — Z90711 Acquired absence of uterus with remaining cervical stump: Secondary | ICD-10-CM

## 2017-08-19 DIAGNOSIS — Z01419 Encounter for gynecological examination (general) (routine) without abnormal findings: Secondary | ICD-10-CM

## 2017-08-19 DIAGNOSIS — Z1239 Encounter for other screening for malignant neoplasm of breast: Secondary | ICD-10-CM

## 2017-08-19 DIAGNOSIS — E669 Obesity, unspecified: Secondary | ICD-10-CM | POA: Diagnosis not present

## 2017-08-19 DIAGNOSIS — E288 Other ovarian dysfunction: Secondary | ICD-10-CM | POA: Diagnosis not present

## 2017-08-19 DIAGNOSIS — N809 Endometriosis, unspecified: Secondary | ICD-10-CM | POA: Diagnosis not present

## 2017-08-19 DIAGNOSIS — Z1231 Encounter for screening mammogram for malignant neoplasm of breast: Secondary | ICD-10-CM | POA: Diagnosis not present

## 2017-08-19 DIAGNOSIS — E2839 Other primary ovarian failure: Secondary | ICD-10-CM

## 2017-08-19 MED ORDER — ESTRADIOL 0.1 MG/24HR TD PTTW: 1.0000 | MEDICATED_PATCH | TRANSDERMAL | 3 refills | Status: AC

## 2017-08-19 NOTE — Patient Instructions (Signed)
1.  Pap smear is not done.  Next Pap smear is due 2021 2.  Mammogram ordered 3.  Screening labs are ordered 4.  Vivelle-Dot is renewed for 1 year and 0.1 mg patch twice weekly 5.  Continue with calcium and vitamin D supplementation daily 6.  Return in 1 year for annual exam  Health Maintenance, Female Adopting a healthy lifestyle and getting preventive care can go a long way to promote health and wellness. Talk with your health care provider about what schedule of regular examinations is right for you. This is a good chance for you to check in with your provider about disease prevention and staying healthy. In between checkups, there are plenty of things you can do on your own. Experts have done a lot of research about which lifestyle changes and preventive measures are most likely to keep you healthy. Ask your health care provider for more information. Weight and diet Eat a healthy diet  Be sure to include plenty of vegetables, fruits, low-fat dairy products, and lean protein.  Do not eat a lot of foods high in solid fats, added sugars, or salt.  Get regular exercise. This is one of the most important things you can do for your health. ? Most adults should exercise for at least 150 minutes each week. The exercise should increase your heart rate and make you sweat (moderate-intensity exercise). ? Most adults should also do strengthening exercises at least twice a week. This is in addition to the moderate-intensity exercise.  Maintain a healthy weight  Body mass index (BMI) is a measurement that can be used to identify possible weight problems. It estimates body fat based on height and weight. Your health care provider can help determine your BMI and help you achieve or maintain a healthy weight.  For females 64 years of age and older: ? A BMI below 18.5 is considered underweight. ? A BMI of 18.5 to 24.9 is normal. ? A BMI of 25 to 29.9 is considered overweight. ? A BMI of 30 and above is  considered obese.  Watch levels of cholesterol and blood lipids  You should start having your blood tested for lipids and cholesterol at 50 years of age, then have this test every 5 years.  You may need to have your cholesterol levels checked more often if: ? Your lipid or cholesterol levels are high. ? You are older than 50 years of age. ? You are at high risk for heart disease.  Cancer screening Lung Cancer  Lung cancer screening is recommended for adults 71-33 years old who are at high risk for lung cancer because of a history of smoking.  A yearly low-dose CT scan of the lungs is recommended for people who: ? Currently smoke. ? Have quit within the past 15 years. ? Have at least a 30-pack-year history of smoking. A pack year is smoking an average of one pack of cigarettes a day for 1 year.  Yearly screening should continue until it has been 15 years since you quit.  Yearly screening should stop if you develop a health problem that would prevent you from having lung cancer treatment.  Breast Cancer  Practice breast self-awareness. This means understanding how your breasts normally appear and feel.  It also means doing regular breast self-exams. Let your health care provider know about any changes, no matter how small.  If you are in your 20s or 30s, you should have a clinical breast exam (CBE) by a health care  provider every 1-3 years as part of a regular health exam.  If you are 44 or older, have a CBE every year. Also consider having a breast X-ray (mammogram) every year.  If you have a family history of breast cancer, talk to your health care provider about genetic screening.  If you are at high risk for breast cancer, talk to your health care provider about having an MRI and a mammogram every year.  Breast cancer gene (BRCA) assessment is recommended for women who have family members with BRCA-related cancers. BRCA-related cancers  include: ? Breast. ? Ovarian. ? Tubal. ? Peritoneal cancers.  Results of the assessment will determine the need for genetic counseling and BRCA1 and BRCA2 testing.  Cervical Cancer Your health care provider may recommend that you be screened regularly for cancer of the pelvic organs (ovaries, uterus, and vagina). This screening involves a pelvic examination, including checking for microscopic changes to the surface of your cervix (Pap test). You may be encouraged to have this screening done every 3 years, beginning at age 3.  For women ages 27-65, health care providers may recommend pelvic exams and Pap testing every 3 years, or they may recommend the Pap and pelvic exam, combined with testing for human papilloma virus (HPV), every 5 years. Some types of HPV increase your risk of cervical cancer. Testing for HPV may also be done on women of any age with unclear Pap test results.  Other health care providers may not recommend any screening for nonpregnant women who are considered low risk for pelvic cancer and who do not have symptoms. Ask your health care provider if a screening pelvic exam is right for you.  If you have had past treatment for cervical cancer or a condition that could lead to cancer, you need Pap tests and screening for cancer for at least 20 years after your treatment. If Pap tests have been discontinued, your risk factors (such as having a new sexual partner) need to be reassessed to determine if screening should resume. Some women have medical problems that increase the chance of getting cervical cancer. In these cases, your health care provider may recommend more frequent screening and Pap tests.  Colorectal Cancer  This type of cancer can be detected and often prevented.  Routine colorectal cancer screening usually begins at 50 years of age and continues through 50 years of age.  Your health care provider may recommend screening at an earlier age if you have risk factors  for colon cancer.  Your health care provider may also recommend using home test kits to check for hidden blood in the stool.  A small camera at the end of a tube can be used to examine your colon directly (sigmoidoscopy or colonoscopy). This is done to check for the earliest forms of colorectal cancer.  Routine screening usually begins at age 27.  Direct examination of the colon should be repeated every 5-10 years through 50 years of age. However, you may need to be screened more often if early forms of precancerous polyps or small growths are found.  Skin Cancer  Check your skin from head to toe regularly.  Tell your health care provider about any new moles or changes in moles, especially if there is a change in a mole's shape or color.  Also tell your health care provider if you have a mole that is larger than the size of a pencil eraser.  Always use sunscreen. Apply sunscreen liberally and repeatedly throughout the day.  Protect yourself by wearing long sleeves, pants, a wide-brimmed hat, and sunglasses whenever you are outside.  Heart disease, diabetes, and high blood pressure  High blood pressure causes heart disease and increases the risk of stroke. High blood pressure is more likely to develop in: ? People who have blood pressure in the high end of the normal range (130-139/85-89 mm Hg). ? People who are overweight or obese. ? People who are African American.  If you are 69-2 years of age, have your blood pressure checked every 3-5 years. If you are 43 years of age or older, have your blood pressure checked every year. You should have your blood pressure measured twice-once when you are at a hospital or clinic, and once when you are not at a hospital or clinic. Record the average of the two measurements. To check your blood pressure when you are not at a hospital or clinic, you can use: ? An automated blood pressure machine at a pharmacy. ? A home blood pressure monitor.  If  you are between 70 years and 16 years old, ask your health care provider if you should take aspirin to prevent strokes.  Have regular diabetes screenings. This involves taking a blood sample to check your fasting blood sugar level. ? If you are at a normal weight and have a low risk for diabetes, have this test once every three years after 50 years of age. ? If you are overweight and have a high risk for diabetes, consider being tested at a younger age or more often. Preventing infection Hepatitis B  If you have a higher risk for hepatitis B, you should be screened for this virus. You are considered at high risk for hepatitis B if: ? You were born in a country where hepatitis B is common. Ask your health care provider which countries are considered high risk. ? Your parents were born in a high-risk country, and you have not been immunized against hepatitis B (hepatitis B vaccine). ? You have HIV or AIDS. ? You use needles to inject street drugs. ? You live with someone who has hepatitis B. ? You have had sex with someone who has hepatitis B. ? You get hemodialysis treatment. ? You take certain medicines for conditions, including cancer, organ transplantation, and autoimmune conditions.  Hepatitis C  Blood testing is recommended for: ? Everyone born from [birth year] through 1963. ? Anyone with known risk factors for hepatitis C.  Sexually transmitted infections (STIs)  You should be screened for sexually transmitted infections (STIs) including gonorrhea and chlamydia if: ? You are sexually active and are younger than 50 years of age. ? You are older than 51 years of age and your health care provider tells you that you are at risk for this type of infection. ? Your sexual activity has changed since you were last screened and you are at an increased risk for chlamydia or gonorrhea. Ask your health care provider if you are at risk.  If you do not have HIV, but are at risk, it may be recommended  that you take a prescription medicine daily to prevent HIV infection. This is called pre-exposure prophylaxis (PrEP). You are considered at risk if: ? You are sexually active and do not regularly use condoms or know the HIV status of your partner(s). ? You take drugs by injection. ? You are sexually active with a partner who has HIV.  Talk with your health care provider about whether you are at high risk of  being infected with HIV. If you choose to begin PrEP, you should first be tested for HIV. You should then be tested every 3 months for as long as you are taking PrEP. Pregnancy  If you are premenopausal and you may become pregnant, ask your health care provider about preconception counseling.  If you may become pregnant, take 400 to 800 micrograms (mcg) of folic acid every day.  If you want to prevent pregnancy, talk to your health care provider about birth control (contraception). Osteoporosis and menopause  Osteoporosis is a disease in which the bones lose minerals and strength with aging. This can result in serious bone fractures. Your risk for osteoporosis can be identified using a bone density scan.  If you are 63 years of age or older, or if you are at risk for osteoporosis and fractures, ask your health care provider if you should be screened.  Ask your health care provider whether you should take a calcium or vitamin D supplement to lower your risk for osteoporosis.  Menopause may have certain physical symptoms and risks.  Hormone replacement therapy may reduce some of these symptoms and risks. Talk to your health care provider about whether hormone replacement therapy is right for you. Follow these instructions at home:  Schedule regular health, dental, and eye exams.  Stay current with your immunizations.  Do not use any tobacco products including cigarettes, chewing tobacco, or electronic cigarettes.  If you are pregnant, do not drink alcohol.  If you are  breastfeeding, limit how much and how often you drink alcohol.  Limit alcohol intake to no more than 1 drink per day for nonpregnant women. One drink equals 12 ounces of beer, 5 ounces of wine, or 1 ounces of hard liquor.  Do not use street drugs.  Do not share needles.  Ask your health care provider for help if you need support or information about quitting drugs.  Tell your health care provider if you often feel depressed.  Tell your health care provider if you have ever been abused or do not feel safe at home. This information is not intended to replace advice given to you by your health care provider. Make sure you discuss any questions you have with your health care provider. Document Released: 09/02/2010 Document Revised: 07/26/2015 Document Reviewed: 11/21/2014 Elsevier Interactive Patient Education  [employer].

## 2017-08-20 LAB — LIPID PANEL
Chol/HDL Ratio: 2.7 ratio (ref 0.0–4.4)
Cholesterol, Total: 240 mg/dL — ABNORMAL HIGH (ref 100–199)
HDL: 89 mg/dL (ref >39)
LDL CALC: 136 mg/dL — AB (ref 0–99)
TRIGLYCERIDES: 74 mg/dL (ref 0–149)
VLDL CHOLESTEROL CAL: 15 mg/dL (ref 5–40)

## 2017-08-20 LAB — GLUCOSE, RANDOM: Glucose: 83 mg/dL (ref 65–99)

## 2017-08-20 LAB — HEMOGLOBIN A1C
Est. average glucose Bld gHb Est-mCnc: 97 mg/dL
HEMOGLOBIN A1C: 5 % (ref 4.8–5.6)

## 2017-08-20 LAB — TSH: TSH: 2.77 u[IU]/mL (ref 0.450–4.500)

## 2017-08-28 DIAGNOSIS — D2272 Melanocytic nevi of left lower limb, including hip: Secondary | ICD-10-CM | POA: Diagnosis not present

## 2017-08-28 DIAGNOSIS — D2261 Melanocytic nevi of right upper limb, including shoulder: Secondary | ICD-10-CM | POA: Diagnosis not present

## 2017-08-28 DIAGNOSIS — D2262 Melanocytic nevi of left upper limb, including shoulder: Secondary | ICD-10-CM | POA: Diagnosis not present

## 2017-08-28 DIAGNOSIS — D225 Melanocytic nevi of trunk: Secondary | ICD-10-CM | POA: Diagnosis not present

## 2017-10-13 ENCOUNTER — Ambulatory Visit
Admission: RE | Admit: 2017-10-13 | Discharge: 2017-10-13 | Disposition: A | Discharge status: Home / Self Care | Payer: BLUE CROSS/BLUE SHIELD | Source: Ambulatory Visit | Attending: Obstetrics and Gynecology | Admitting: Obstetrics and Gynecology

## 2017-10-13 DIAGNOSIS — Z1231 Encounter for screening mammogram for malignant neoplasm of breast: Secondary | ICD-10-CM | POA: Diagnosis not present

## 2017-10-13 DIAGNOSIS — Z1239 Encounter for other screening for malignant neoplasm of breast: Secondary | ICD-10-CM

## 2017-11-01 DIAGNOSIS — Z86010 Personal history of colonic polyps: Secondary | ICD-10-CM

## 2017-11-01 DIAGNOSIS — Z860101 Personal history of adenomatous and serrated colon polyps: Secondary | ICD-10-CM

## 2017-11-01 HISTORY — DX: Personal history of colonic polyps: Z86.010

## 2017-11-01 HISTORY — DX: Personal history of adenomatous and serrated colon polyps: Z86.0101

## 2018-07-19 ENCOUNTER — Telehealth: Payer: Self-pay

## 2018-07-19 NOTE — Telephone Encounter (Signed)
Copied from [place] [id number]. Topic: Appointment Scheduling - Scheduling Inquiry for Clinic >> Jul 19, 2018  9:17 AM Erick Blinks wrote: Reason for CRM: Pt called to establish new primary care with Dr. Jacklynn Lewis, I called thrice but was unable to get connected with front office. Pt plans to call back later if she does not receive a follow up call beforehand. Please advise Best Contact: [phone]

## 2018-08-19 ENCOUNTER — Other Ambulatory Visit: Payer: Self-pay | Admitting: Obstetrics & Gynecology

## 2018-08-19 DIAGNOSIS — M8580 Other specified disorders of bone density and structure, unspecified site: Secondary | ICD-10-CM

## 2018-08-19 DIAGNOSIS — Z1231 Encounter for screening mammogram for malignant neoplasm of breast: Secondary | ICD-10-CM

## 2018-08-24 ENCOUNTER — Encounter: Payer: BLUE CROSS/BLUE SHIELD | Admitting: Obstetrics and Gynecology

## 2018-08-24 ENCOUNTER — Encounter: Payer: Self-pay | Admitting: Obstetrics and Gynecology

## 2018-10-19 ENCOUNTER — Ambulatory Visit
Admission: RE | Admit: 2018-10-19 | Discharge: 2018-10-19 | Disposition: A | Discharge status: Home / Self Care | Payer: Managed Care, Other (non HMO) | Source: Ambulatory Visit | Attending: Obstetrics & Gynecology | Admitting: Obstetrics & Gynecology

## 2018-10-19 DIAGNOSIS — Z1231 Encounter for screening mammogram for malignant neoplasm of breast: Secondary | ICD-10-CM

## 2018-10-19 DIAGNOSIS — M8580 Other specified disorders of bone density and structure, unspecified site: Secondary | ICD-10-CM | POA: Diagnosis not present

## 2018-10-22 ENCOUNTER — Encounter: Payer: Self-pay | Admitting: Obstetrics and Gynecology

## 2018-12-02 ENCOUNTER — Other Ambulatory Visit: Payer: Self-pay

## 2018-12-03 ENCOUNTER — Telehealth: Payer: Self-pay | Admitting: Internal Medicine

## 2018-12-03 ENCOUNTER — Telehealth: Payer: Self-pay | Admitting: Obstetrics & Gynecology

## 2018-12-03 ENCOUNTER — Encounter: Payer: Self-pay | Admitting: Internal Medicine

## 2018-12-03 ENCOUNTER — Other Ambulatory Visit: Payer: Self-pay

## 2018-12-03 ENCOUNTER — Ambulatory Visit: Payer: Managed Care, Other (non HMO) | Admitting: Internal Medicine

## 2018-12-03 VITALS — BP 128/78 | HR 73 | Temp 98.0°F | Ht 62.0 in | Wt 168.6 lb

## 2018-12-03 DIAGNOSIS — Z23 Encounter for immunization: Secondary | ICD-10-CM | POA: Diagnosis not present

## 2018-12-03 DIAGNOSIS — E875 Hyperkalemia: Secondary | ICD-10-CM

## 2018-12-03 DIAGNOSIS — Z1329 Encounter for screening for other suspected endocrine disorder: Secondary | ICD-10-CM

## 2018-12-03 DIAGNOSIS — Z0000 Encounter for general adult medical examination without abnormal findings: Secondary | ICD-10-CM | POA: Diagnosis not present

## 2018-12-03 DIAGNOSIS — F329 Major depressive disorder, single episode, unspecified: Secondary | ICD-10-CM | POA: Insufficient documentation

## 2018-12-03 DIAGNOSIS — E559 Vitamin D deficiency, unspecified: Secondary | ICD-10-CM

## 2018-12-03 DIAGNOSIS — Z1389 Encounter for screening for other disorder: Secondary | ICD-10-CM

## 2018-12-03 DIAGNOSIS — F32A Depression, unspecified: Secondary | ICD-10-CM | POA: Insufficient documentation

## 2018-12-03 DIAGNOSIS — E785 Hyperlipidemia, unspecified: Secondary | ICD-10-CM | POA: Insufficient documentation

## 2018-12-03 NOTE — Addendum Note (Signed)
Addended by: Orland Mustard on: 12/03/2018 05:36 PM   Modules accepted: Orders

## 2018-12-03 NOTE — Progress Notes (Signed)
Chief Complaint  Patient presents with  . Establish Care   Annual doing well  1. HLD 08/19/18 TC 216, LDL 128 and HDL 74 and TG 70  2. No complaints needs PCP  Review of Systems  Constitutional: Negative for weight loss.  HENT: Negative for hearing loss.   Eyes: Negative for blurred vision.  Respiratory: Negative for shortness of breath.   Cardiovascular: Negative for chest pain.  Gastrointestinal: Negative for abdominal pain.  Musculoskeletal: Negative for falls.  Skin: Negative for rash.  Neurological: Negative for headaches.  Psychiatric/Behavioral: Negative for depression.   Past Medical History:  Diagnosis Date  . Allergies   . Colon polyps   . Decreased sex drive   . Depression   . GERD (gastroesophageal reflux disease)   . HLD (hyperlipidemia)   . Hypercholesteremia   . Osteopenia   . Premature ovarian failure   . UTI (urinary tract infection)    Past Surgical History:  Procedure Laterality Date  . ABDOMINAL HYSTERECTOMY     lsh  . CHOLECYSTECTOMY    . TONSILLECTOMY AND ADENOIDECTOMY     Family History  Problem Relation Age of Onset  . Osteoporosis Mother   . Diabetes Mother   . Depression Mother   . Diabetes gravidarum Mother   . Heart attack Mother   . Hypertension Mother   . Hyperlipidemia Mother   . Mental illness Mother   . Heart disease Maternal Grandfather   . Drug abuse Father   . Hearing loss Father   . Hyperlipidemia Father   . Alcohol abuse Brother   . Drug abuse Brother   . Cancer Neg Hx   . Ovarian cancer Neg Hx   . Colon polyps Neg Hx   . Breast cancer Neg Hx    Social History   Socioeconomic History  . Marital status: Married    Spouse name: Not on file  . Number of children: Not on file  . Years of education: Not on file  . Highest education level: Not on file  Occupational History  . Not on file  Social Needs  . Financial resource strain: Not on file  . Food insecurity    Worry: Not on file    Inability: Not on file  .  Transportation needs    Medical: Not on file    Non-medical: Not on file  Tobacco Use  . Smoking status: Never Smoker  . Smokeless tobacco: Never Used  Substance and Sexual Activity  . Alcohol use: Yes    Comment: social  . Drug use: No  . Sexual activity: Yes    Birth control/protection: Surgical  Lifestyle  . Physical activity    Days per week: 5 days    Minutes per session: 60 min  . Stress: Not on file  Relationships  . Social Herbalist on phone: Not on file    Gets together: Not on file    Attends religious service: Not on file    Active member of club or organization: Not on file    Attends meetings of clubs or organizations: Not on file    Relationship status: Not on file  . Intimate partner violence    Fear of current or ex partner: Not on file    Emotionally abused: Not on file    Physically abused: Not on file    Forced sexual activity: Not on file  Other Topics Concern  . Not on file  Social History Narrative  .  Not on file   Current Meds  Medication Sig  . calcium-vitamin D 250-100 MG-UNIT tablet Take 1 tablet by mouth 2 (two) times daily.  Marland Kitchen estradiol (VIVELLE-DOT) 0.1 MG/24HR patch Place 1 patch (0.1 mg total) onto the skin 2 (two) times a week.   No Known Allergies No results found for this or any previous visit (from the past 2160 hour(s)). Objective  Body mass index is 30.84 kg/m. Wt Readings from Last 3 Encounters:  12/03/18 168 lb 9.6 oz (76.5 kg)  08/19/17 165 lb 8 oz (75.1 kg)  08/07/16 163 lb 6.4 oz (74.1 kg)   Temp Readings from Last 3 Encounters:  12/03/18 98 F (36.7 C) (Oral)   BP Readings from Last 3 Encounters:  12/03/18 128/78  08/19/17 122/71  08/07/16 107/71   Pulse Readings from Last 3 Encounters:  12/03/18 73  08/19/17 66  08/07/16 63    Physical Exam Vitals signs and nursing note reviewed.  Constitutional:      Appearance: Normal appearance. She is well-developed and well-groomed.  HENT:     Head:  Normocephalic and atraumatic.     Comments: +mask on   Eyes:     Conjunctiva/sclera: Conjunctivae normal.     Pupils: Pupils are equal, round, and reactive to light.  Cardiovascular:     Rate and Rhythm: Normal rate and regular rhythm.     Heart sounds: Normal heart sounds. No murmur.  Pulmonary:     Effort: Pulmonary effort is normal.     Breath sounds: Normal breath sounds.  Abdominal:     Tenderness: There is no abdominal tenderness.  Skin:    General: Skin is warm and dry.  Neurological:     General: No focal deficit present.     Mental Status: She is alert and oriented to person, place, and time. Mental status is at baseline.     Gait: Gait normal.  Psychiatric:        Attention and Perception: Attention and perception normal.        Mood and Affect: Mood and affect normal.        Speech: Speech normal.        Behavior: Behavior normal.        Thought Content: Thought content normal.        Cognition and Memory: Cognition and memory normal.        Judgment: Judgment normal.     Assessment  Plan  Annual physical exam Reviewed labs 08/19/2018 Declines flu shot  tdap given today  Disc shingrix at f/u   S/p hyst endometriosis pap 2018 negative pap neg hpv mammo 10/19/18 neg  dexa 10/19/18 neg  Colonoscopy 11/19/17 requested records  Derm Dr. Kellie Moor due to see 2020  rec D3 2000 Iu qd   covid + summer 2020   Hyperkalemia - Plan: Basic Metabolic Panel (BMET)   Hyperlipidemia, unspecified hyperlipidemia type Given cholesterol info    Ob/gyn Dr. Leonides Schanz   Provider: Dr. Olivia Mackie McLean-Scocuz[facility]

## 2018-12-03 NOTE — Patient Instructions (Addendum)
D3 2000 daily    High Cholesterol  High cholesterol is a condition in which the blood has high levels of a white, waxy, fat-like substance (cholesterol). The human body needs small amounts of cholesterol. The liver makes all the cholesterol that the body needs. Extra (excess) cholesterol comes from the food that we eat. Cholesterol is carried from the liver by the blood through the blood vessels. If you have high cholesterol, deposits (plaques) may build up on the walls of your blood vessels (arteries). Plaques make the arteries narrower and stiffer. Cholesterol plaques increase your risk for heart attack and stroke. Work with your health care provider to keep your cholesterol levels in a healthy range. What increases the risk? This condition is more likely to develop in people who:  Eat foods that are high in animal fat (saturated fat) or cholesterol.  Are overweight.  Are not getting enough exercise.  Have a family history of high cholesterol. What are the signs or symptoms? There are no symptoms of this condition. How is this diagnosed? This condition may be diagnosed from the results of a blood test.  If you are older than age 63, your health care provider may check your cholesterol every 4-6 years.  You may be checked more often if you already have high cholesterol or other risk factors for heart disease. The blood test for cholesterol measures:  "Bad" cholesterol (LDL cholesterol). This is the main type of cholesterol that causes heart disease. The desired level for LDL is less than 100.  "Good" cholesterol (HDL cholesterol). This type helps to protect against heart disease by cleaning the arteries and carrying the LDL away. The desired level for HDL is 60 or higher.  Triglycerides. These are fats that the body can store or burn for energy. The desired number for triglycerides is lower than 150.  Total cholesterol. This is a measure of the total amount of cholesterol in your  blood, including LDL cholesterol, HDL cholesterol, and triglycerides. A healthy number is less than 200. How is this treated? This condition is treated with diet changes, lifestyle changes, and medicines. Diet changes  This may include eating more whole grains, fruits, vegetables, nuts, and fish.  This may also include cutting back on red meat and foods that have a lot of added sugar. Lifestyle changes  Changes may include getting at least 40 minutes of aerobic exercise 3 times a week. Aerobic exercises include walking, biking, and swimming. Aerobic exercise along with a healthy diet can help you maintain a healthy weight.  Changes may also include quitting smoking. Medicines  Medicines are usually given if diet and lifestyle changes have failed to reduce your cholesterol to healthy levels.  Your health care provider may prescribe a statin medicine. Statin medicines have been shown to reduce cholesterol, which can reduce the risk of heart disease. Follow these instructions at home: Eating and drinking If told by your health care provider:  Eat chicken (without skin), fish, veal, shellfish, ground [place] breast, and round or loin cuts of red meat.  Do not eat fried foods or fatty meats, such as hot dogs and salami.  Eat plenty of fruits, such as apples.  Eat plenty of vegetables, such as broccoli, potatoes, and carrots.  Eat beans, peas, and lentils.  Eat grains such as barley, rice, couscous, and bulgur wheat.  Eat pasta without cream sauces.  Use skim or nonfat milk, and eat low-fat or nonfat yogurt and cheeses.  Do not eat or drink whole  milk, cream, ice cream, egg yolks, or hard cheeses.  Do not eat stick margarine or tub margarines that contain trans fats (also called partially hydrogenated oils).  Do not eat saturated tropical oils, such as coconut oil and palm oil.  Do not eat cakes, cookies, crackers, or other baked goods that contain trans fats.  General  instructions  Exercise as directed by your health care provider. Increase your activity level with activities such as gardening, walking, and taking the stairs.  Take over-the-counter and prescription medicines only as told by your health care provider.  Do not use any products that contain nicotine or tobacco, such as cigarettes and e-cigarettes. If you need help quitting, ask your health care provider.  Keep all follow-up visits as told by your health care provider. This is important. Contact a health care provider if:  You are struggling to maintain a healthy diet or weight.  You need help to start on an exercise program.  You need help to stop smoking. Get help right away if:  You have chest pain.  You have trouble breathing. This information is not intended to replace advice given to you by your health care provider. Make sure you discuss any questions you have with your health care provider. Document Released: 02/17/2005 Document Revised: 02/20/2017 Document Reviewed: 08/18/2015 Elsevier Patient Education  [place].  Cholesterol Content in Foods Cholesterol is a waxy, fat-like substance that helps to carry fat in the blood. The body needs cholesterol in small amounts, but too much cholesterol can cause damage to the arteries and heart. Most people should eat less than 200 milligrams (mg) of cholesterol a day. Foods with cholesterol  Cholesterol is found in animal-based foods, such as meat, seafood, and dairy. Generally, low-fat dairy and lean meats have less cholesterol than full-fat dairy and fatty meats. The milligrams of cholesterol per serving (mg per serving) of common cholesterol-containing foods are listed below. Meat and other proteins  Egg - one large whole egg has 186 mg.  Veal shank - 4 oz has 141 mg.  Lean ground [place] (93% lean) - 4 oz has 118 mg.  Fat-trimmed lamb loin - 4 oz has 106 mg.  Lean ground beef (90% lean) - 4 oz has 100 mg.  Lobster  - 3.5 oz has 90 mg.  Pork loin chops - 4 oz has 86 mg.  Canned salmon - 3.5 oz has 83 mg.  Fat-trimmed beef top loin - 4 oz has 78 mg.  Frankfurter - 1 frank (3.5 oz) has 77 mg.  Crab - 3.5 oz has 71 mg.  Roasted chicken without skin, white meat - 4 oz has 66 mg.  Light bologna - 2 oz has 45 mg.  Deli-cut [place] - 2 oz has 31 mg.  Canned tuna - 3.5 oz has 31 mg.  Bacon - 1 oz has 29 mg.  Oysters and mussels (raw) - 3.5 oz has 25 mg.  Mackerel - 1 oz has 22 mg.  Trout - 1 oz has 20 mg.  Pork sausage - 1 link (1 oz) has 17 mg.  Salmon - 1 oz has 16 mg.  Tilapia - 1 oz has 14 mg. Dairy  Soft-serve ice cream -  cup (4 oz) has 103 mg.  Whole-milk yogurt - 1 cup (8 oz) has 29 mg.  Cheddar cheese - 1 oz has 28 mg.  American cheese - 1 oz has 28 mg.  Whole milk - 1 cup (8 oz) has 23 mg.  2% milk - 1 cup (8 oz) has 18 mg.  Cream cheese - 1 tablespoon (Tbsp) has 15 mg.  Cottage cheese -  cup (4 oz) has 14 mg.  Low-fat (1%) milk - 1 cup (8 oz) has 10 mg.  Sour cream - 1 Tbsp has 8.5 mg.  Low-fat yogurt - 1 cup (8 oz) has 8 mg.  Nonfat Greek yogurt - 1 cup (8 oz) has 7 mg.  Half-and-half cream - 1 Tbsp has 5 mg. Fats and oils  Cod liver oil - 1 tablespoon (Tbsp) has 82 mg.  Butter - 1 Tbsp has 15 mg.  Lard - 1 Tbsp has 14 mg.  Bacon grease - 1 Tbsp has 14 mg.  Mayonnaise - 1 Tbsp has 5-10 mg.  Margarine - 1 Tbsp has 3-10 mg. Exact amounts of cholesterol in these foods may vary depending on specific ingredients and brands. Foods without cholesterol Most plant-based foods do not have cholesterol unless you combine them with a food that has cholesterol. Foods without cholesterol include:  Grains and cereals.  Vegetables.  Fruits.  Vegetable oils, such as olive, canola, and sunflower oil.  Legumes, such as peas, beans, and lentils.  Nuts and seeds.  Egg whites. Summary  The body needs cholesterol in small amounts, but too much cholesterol  can cause damage to the arteries and heart.  Most people should eat less than 200 milligrams (mg) of cholesterol a day. This information is not intended to replace advice given to you by your health care provider. Make sure you discuss any questions you have with your health care provider. Document Released: 10/14/2016 Document Revised: 01/30/2017 Document Reviewed: 10/14/2016 Elsevier Patient Education  [place].  https://www.cdc.gov/vaccines/hcp/vis/vis-statements/tdap.pdf">  Tdap Vaccine (Tetanus, Diphtheria and Pertussis): What You Need to Know 1. Why get vaccinated? Tetanus, diphtheria and pertussis are very serious diseases. Tdap vaccine can protect [place] from these diseases. And, Tdap vaccine given to pregnant women can protect newborn babies against pertussis.[employer] [employer] TETANUS (Lockjaw) is rare in the [place] today. It causes painful muscle tightening and stiffness, usually all over the body.  It can lead to tightening of muscles in the head and neck so you can't open your mouth, swallow, or sometimes even breathe. Tetanus kills about 1 out of 10 people who are infected even after receiving the best medical care. DIPHTHERIA is also rare in the [place] today. It can cause a thick coating to form in the back of the throat.  It can lead to breathing problems, heart failure, paralysis, and death. PERTUSSIS (Whooping Cough) causes severe coughing spells, which can cause difficulty breathing, vomiting and disturbed sleep.  It can also lead to weight loss, incontinence, and rib fractures. Up to 2 in 100 adolescents and 5 in 100 adults with pertussis are hospitalized or have complications, which could include pneumonia or death. These diseases are caused by bacteria. Diphtheria and pertussis are spread from person to person through secretions from coughing or sneezing. Tetanus enters the body through cuts, scratches, or wounds. Before vaccines, as many as 200,000 cases of diphtheria,  200,000 cases of pertussis, and hundreds of cases of tetanus, were reported in the [place] each year. Since vaccination began, reports of cases for tetanus and diphtheria have dropped by about 99% and for pertussis by about 80%. 2. Tdap vaccine Tdap vaccine can protect adolescents and adults from tetanus, diphtheria, and pertussis. One dose of Tdap is routinely given at age 64 or 46. People who did not get  Tdap at that age should get it as soon as possible. Tdap is especially important for healthcare professionals and anyone having close contact with a baby younger than 12 months. Pregnant women should get a dose of Tdap during every pregnancy, to protect the newborn from pertussis. Infants are most at risk for severe, life-threatening complications from pertussis. Another vaccine, called Td, protects against tetanus and diphtheria, but not pertussis. A Td booster should be given every 10 years. Tdap may be given as one of these boosters if you have never gotten Tdap before. Tdap may also be given after a severe cut or burn to prevent tetanus infection. Your doctor or the person giving you the vaccine can give you more information. Tdap may safely be given at the same time as other vaccines. 3. Some people should not get this vaccine  A person who has ever had a life-threatening allergic reaction after a previous dose of any diphtheria, tetanus or pertussis containing vaccine, OR has a severe allergy to any part of this vaccine, should not get Tdap vaccine. Tell the person giving the vaccine about any severe allergies.  Anyone who had coma or long repeated seizures within 7 days after a childhood dose of DTP or DTaP, or a previous dose of Tdap, should not get Tdap, unless a cause other than the vaccine was found. They can still get Td.  Talk to your doctor if you: ? have seizures or another nervous system problem, ? had severe pain or swelling after any vaccine containing diphtheria, tetanus or  pertussis, ? ever had a condition called Guillain-Barr Syndrome (GBS), ? aren't feeling well on the day the shot is scheduled. 4. Risks With any medicine, including vaccines, there is a chance of side effects. These are usually mild and go away on their own. Serious reactions are also possible but are rare. Most people who get Tdap vaccine do not have any problems with it. Mild problems following Tdap (Did not interfere with activities)  Pain where the shot was given (about 3 in 4 adolescents or 2 in 3 adults)  Redness or swelling where the shot was given (about 1 person in 5)  Mild fever of at least 100.46F (up to about 1 in 25 adolescents or 1 in 100 adults)  Headache (about 3 or 4 people in 10)  Tiredness (about 1 person in 3 or 4)  Nausea, vomiting, diarrhea, stomach ache (up to 1 in 4 adolescents or 1 in 10 adults)  Chills, sore joints (about 1 person in 10)  Body aches (about 1 person in 3 or 4)  Rash, swollen glands (uncommon) Moderate problems following Tdap (Interfered with activities, but did not require medical attention)  Pain where the shot was given (up to 1 in 5 or 6)  Redness or swelling where the shot was given (up to about 1 in 16 adolescents or 1 in 12 adults)  Fever over 102F (about 1 in 100 adolescents or 1 in 250 adults)  Headache (about 1 in 7 adolescents or 1 in 10 adults)  Nausea, vomiting, diarrhea, stomach ache (up to 1 or 3 people in 100)  Swelling of the entire arm where the shot was given (up to about 1 in 500). Severe problems following Tdap (Unable to perform usual activities; required medical attention)  Swelling, severe pain, bleeding and redness in the arm where the shot was given (rare). Problems that could happen after any vaccine:  People sometimes faint after a medical procedure, including vaccination.  Sitting or lying down for about 15 minutes can help prevent fainting, and injuries caused by a fall. Tell your doctor if you feel  dizzy, or have vision changes or ringing in the ears.  Some people get severe pain in the shoulder and have difficulty moving the arm where a shot was given. This happens very rarely.  Any medication can cause a severe allergic reaction. Such reactions from a vaccine are very rare, estimated at fewer than 1 in a million doses, and would happen within a few minutes to a few hours after the vaccination. As with any medicine, there is a very remote chance of a vaccine causing a serious injury or death. The safety of vaccines is always being monitored. For more information, visit: http://www.aguilar.org/ 5. What if there is a serious problem? What should I look for?  Look for anything that concerns you, such as signs of a severe allergic reaction, very high fever, or unusual behavior. Signs of a severe allergic reaction can include hives, swelling of the face and throat, difficulty breathing, a fast heartbeat, dizziness, and weakness. These would usually start a few minutes to a few hours after the vaccination. What should I do?  If you think it is a severe allergic reaction or other emergency that can't wait, call 9-1-1 or get the person to the nearest hospital. Otherwise, call your doctor.  Afterward, the reaction should be reported to the Vaccine Adverse Event Reporting System (VAERS). Your doctor might file this report, or you can do it yourself through the VAERS web site at www.vaers.SamedayNews.es, or by calling (617) 568-2669. VAERS does not give medical advice. 6. The National Vaccine Injury Compensation Program The [employer] Vaccine Injury Compensation Program (VICP) is a federal program that was created to compensate people who may have been injured by certain vaccines. Persons who believe they may have been injured by a vaccine can learn about the program and about filing a claim by calling 816-463-5260 or visiting the [place] website at GoldCloset.com.ee. There is a time limit to  file a claim for compensation. 7. How can I learn more?  Ask your doctor. He or she can give you the vaccine package insert or suggest other sources of information.  Call your local or state health department.  Contact the Centers for Disease Control and Prevention (CDC): ? Call 272-154-7252 (1-800-CDC-INFO) or ? Visit CDC's website at http://hunter.com/ Vaccine Information Statement Tdap Vaccine (04/26/2013) This information is not intended to replace advice given to you by your health care provider. Make sure you discuss any questions you have with your health care provider. Document Released: 08/19/2011 Document Revised: 10/05/2017 Document Reviewed: 10/05/2017 Elsevier Interactive Patient Education  [place].  Gastroesophageal Reflux Disease, Adult Gastroesophageal reflux (GER) happens when acid from the stomach flows up into the tube that connects the mouth and the stomach (esophagus). Normally, food travels down the esophagus and stays in the stomach to be digested. However, when a person has GER, food and stomach acid sometimes move back up into the esophagus. If this becomes a more serious problem, the person may be diagnosed with a disease called gastroesophageal reflux disease (GERD). GERD occurs when the reflux:  Happens often.  Causes frequent or severe symptoms.  Causes problems such as damage to the esophagus. When stomach acid comes in contact with the esophagus, the acid may cause soreness (inflammation) in the esophagus. Over time, GERD may create small holes (ulcers) in the lining of the esophagus. What are the causes? This  condition is caused by a problem with the muscle between the esophagus and the stomach (lower esophageal sphincter, or LES). Normally, the LES muscle closes after food passes through the esophagus to the stomach. When the LES is weakened or abnormal, it does not close properly, and that allows food and stomach acid to go back up into the  esophagus. The LES can be weakened by certain dietary substances, medicines, and medical conditions, including:  Tobacco use.  Pregnancy.  Having a hiatal hernia.  Alcohol use.  Certain foods and beverages, such as coffee, chocolate, onions, and peppermint. What increases the risk? You are more likely to develop this condition if you:  Have an increased body weight.  Have a connective tissue disorder.  Use NSAID medicines. What are the signs or symptoms? Symptoms of this condition include:  Heartburn.  Difficult or painful swallowing.  The feeling of having a lump in the throat.  Abitter taste in the mouth.  Bad breath.  Having a large amount of saliva.  Having an upset or bloated stomach.  Belching.  Chest pain. Different conditions can cause chest pain. Make sure you see your health care provider if you experience chest pain.  Shortness of breath or wheezing.  Ongoing (chronic) cough or a night-time cough.  Wearing away of tooth enamel.  Weight loss. How is this diagnosed? Your health care provider will take a medical history and perform a physical exam. To determine if you have mild or severe GERD, your health care provider may also monitor how you respond to treatment. You may also have tests, including:  A test to examine your stomach and esophagus with a small camera (endoscopy).  A test thatmeasures the acidity level in your esophagus.  A test thatmeasures how much pressure is on your esophagus.  A barium swallow or modified barium swallow test to show the shape, size, and functioning of your esophagus. How is this treated? The goal of treatment is to help relieve your symptoms and to prevent complications. Treatment for this condition may vary depending on how severe your symptoms are. Your health care provider may recommend:  Changes to your diet.  Medicine.  Surgery. Follow these instructions at home: Eating and drinking   Follow a  diet as recommended by your health care provider. This may involve avoiding foods and drinks such as: ? Coffee and tea (with or without caffeine). ? Drinks that containalcohol. ? Energy drinks and sports drinks. ? Carbonated drinks or sodas. ? Chocolate and cocoa. ? Peppermint and mint flavorings. ? Garlic and onions. ? Horseradish. ? Spicy and acidic foods, including peppers, chili powder, curry powder, vinegar, hot sauces, and barbecue sauce. ? Citrus fruit juices and citrus fruits, such as oranges, lemons, and limes. ? Tomato-based foods, such as red sauce, chili, salsa, and pizza with red sauce. ? Fried and fatty foods, such as donuts, french fries, potato chips, and high-fat dressings. ? High-fat meats, such as hot dogs and fatty cuts of red and white meats, such as rib eye steak, sausage, ham, and bacon. ? High-fat dairy items, such as whole milk, butter, and cream cheese.  Eat small, frequent meals instead of large meals.  Avoid drinking large amounts of liquid with your meals.  Avoid eating meals during the 2-3 hours before bedtime.  Avoid lying down right after you eat.  Do not exercise right after you eat. Lifestyle   Do not use any products that contain nicotine or tobacco, such as cigarettes, e-cigarettes, and chewing  tobacco. If you need help quitting, ask your health care provider.  Try to reduce your stress by using methods such as yoga or meditation. If you need help reducing stress, ask your health care provider.  If you are overweight, reduce your weight to an amount that is healthy for you. Ask your health care provider for guidance about a safe weight loss goal. General instructions  Pay attention to any changes in your symptoms.  Take over-the-counter and prescription medicines only as told by your health care provider. Do not take aspirin, ibuprofen, or other NSAIDs unless your health care provider told you to do so.  Wear loose-fitting clothing. Do not  wear anything tight around your waist that causes pressure on your abdomen.  Raise (elevate) the head of your bed about 6 inches (15 cm).  Avoid bending over if this makes your symptoms worse.  Keep all follow-up visits as told by your health care provider. This is important. Contact a health care provider if:  You have: ? New symptoms. ? Unexplained weight loss. ? Difficulty swallowing or it hurts to swallow. ? Wheezing or a persistent cough. ? A hoarse voice.  Your symptoms do not improve with treatment. Get help right away if you:  Have pain in your arms, neck, jaw, teeth, or back.  Feel sweaty, dizzy, or light-headed.  Have chest pain or shortness of breath.  Vomit and your vomit looks like blood or coffee grounds.  Faint.  Have stool that is bloody or black.  Cannot swallow, drink, or eat. Summary  Gastroesophageal reflux happens when acid from the stomach flows up into the esophagus. GERD is a disease in which the reflux happens often, causes frequent or severe symptoms, or causes problems such as damage to the esophagus.  Treatment for this condition may vary depending on how severe your symptoms are. Your health care provider may recommend diet and lifestyle changes, medicine, or surgery.  Contact a health care provider if you have new or worsening symptoms.  Take over-the-counter and prescription medicines only as told by your health care provider. Do not take aspirin, ibuprofen, or other NSAIDs unless your health care provider told you to do so.  Keep all follow-up visits as told by your health care provider. This is important. This information is not intended to replace advice given to you by your health care provider. Make sure you discuss any questions you have with your health care provider. Document Released: 11/27/2004 Document Revised: 08/26/2017 Document Reviewed: 08/26/2017 Elsevier Patient Education  2020 [place] for  Gastroesophageal Reflux Disease, Adult When you have gastroesophageal reflux disease (GERD), the foods you eat and your eating habits are very important. Choosing the right foods can help ease the discomfort of GERD. Consider working with a diet and nutrition specialist (dietitian) to help you make healthy food choices. What general guidelines should I follow?  Eating plan  Choose healthy foods low in fat, such as fruits, vegetables, whole grains, low-fat dairy products, and lean meat, fish, and poultry.  Eat frequent, small meals instead of three large meals each day. Eat your meals slowly, in a relaxed setting. Avoid bending over or lying down until 2-3 hours after eating.  Limit high-fat foods such as fatty meats or fried foods.  Limit your intake of oils, butter, and shortening to less than 8 teaspoons each day.  Avoid the following: ? Foods that cause symptoms. These may be different for different people. Keep a food diary to  keep track of foods that cause symptoms. ? Alcohol. ? Drinking large amounts of liquid with meals. ? Eating meals during the 2-3 hours before bed.  Cook foods using methods other than frying. This may include baking, grilling, or broiling. Lifestyle  Maintain a healthy weight. Ask your health care provider what weight is healthy for you. If you need to lose weight, work with your health care provider to do so safely.  Exercise for at least 30 minutes on 5 or more days each week, or as told by your health care provider.  Avoid wearing clothes that fit tightly around your waist and chest.  Do not use any products that contain nicotine or tobacco, such as cigarettes and e-cigarettes. If you need help quitting, ask your health care provider.  Sleep with the head of your bed raised. Use a wedge under the mattress or blocks under the bed frame to raise the head of the bed. What foods are not recommended? The items listed may not be a complete list. Talk with your  dietitian about what dietary choices are best for you. Grains Pastries or quick breads with added fat. [place] toast. Vegetables Deep fried vegetables. [place] fries. Any vegetables prepared with added fat. Any vegetables that cause symptoms. For some people this may include tomatoes and tomato products, chili peppers, onions and garlic, and horseradish. Fruits Any fruits prepared with added fat. Any fruits that cause symptoms. For some people this may include citrus fruits, such as oranges, grapefruit, pineapple, and lemons. Meats and other protein foods High-fat meats, such as fatty beef or pork, hot dogs, ribs, ham, sausage, salami and bacon. Fried meat or protein, including fried fish and fried chicken. Nuts and nut butters. Dairy Whole milk and chocolate milk. Sour cream. Cream. Ice cream. Cream cheese. Milk shakes. Beverages Coffee and tea, with or without caffeine. Carbonated beverages. Sodas. Energy drinks. Fruit juice made with acidic fruits (such as orange or grapefruit). Tomato juice. Alcoholic drinks. Fats and oils Butter. Margarine. Shortening. Ghee. Sweets and desserts Chocolate and cocoa. Donuts. Seasoning and other foods Pepper. Peppermint and spearmint. Any condiments, herbs, or seasonings that cause symptoms. For some people, this may include curry, hot sauce, or vinegar-based salad dressings. Summary  When you have gastroesophageal reflux disease (GERD), food and lifestyle choices are very important to help ease the discomfort of GERD.  Eat frequent, small meals instead of three large meals each day. Eat your meals slowly, in a relaxed setting. Avoid bending over or lying down until 2-3 hours after eating.  Limit high-fat foods such as fatty meat or fried foods. This information is not intended to replace advice given to you by your health care provider. Make sure you discuss any questions you have with your health care provider. Document Released: 02/17/2005 Document  Revised: 06/10/2018 Document Reviewed: 02/19/2016 Elsevier Patient Education  2020 [employer].

## 2018-12-03 NOTE — Telephone Encounter (Signed)
I called pt and left a vm for pt to call ofc to schedule Labs fasting 08/19/18 and f/u in 1 year

## 2018-12-03 NOTE — Progress Notes (Signed)
Pre visit review using our clinic review tool, if applicable. No additional management support is needed unless otherwise documented below in the visit note. 

## 2018-12-03 NOTE — Telephone Encounter (Signed)
err

## 2018-12-04 LAB — BASIC METABOLIC PANEL
BUN: 13 mg/dL (ref 7–25)
CO2: 27 mmol/L (ref 20–32)
Calcium: 9.8 mg/dL (ref 8.6–10.4)
Chloride: 106 mmol/L (ref 98–110)
Creat: 0.73 mg/dL (ref 0.50–1.05)
Glucose, Bld: 93 mg/dL (ref 65–99)
Potassium: 4.2 mmol/L (ref 3.5–5.3)
Sodium: 140 mmol/L (ref 135–146)

## 2018-12-04 LAB — URINALYSIS, ROUTINE W REFLEX MICROSCOPIC
Bacteria, UA: NONE SEEN /HPF
Bilirubin Urine: NEGATIVE
Glucose, UA: NEGATIVE
Hgb urine dipstick: NEGATIVE
Hyaline Cast: NONE SEEN /LPF
Ketones, ur: NEGATIVE
Nitrite: NEGATIVE
Protein, ur: NEGATIVE
Specific Gravity, Urine: 1.016 (ref 1.001–1.03)
pH: 7 (ref 5.0–8.0)

## 2018-12-13 NOTE — Addendum Note (Signed)
Addended by: Elpidio Galea T on: 12/13/2018 08:25 AM   Modules accepted: Orders

## 2019-07-12 ENCOUNTER — Encounter: Payer: BLUE CROSS/BLUE SHIELD | Admitting: Obstetrics and Gynecology

## 2019-08-19 ENCOUNTER — Other Ambulatory Visit (INDEPENDENT_AMBULATORY_CARE_PROVIDER_SITE_OTHER): Payer: Managed Care, Other (non HMO)

## 2019-08-19 ENCOUNTER — Other Ambulatory Visit: Payer: Self-pay

## 2019-08-19 ENCOUNTER — Other Ambulatory Visit: Payer: Self-pay | Admitting: Internal Medicine

## 2019-08-19 DIAGNOSIS — E785 Hyperlipidemia, unspecified: Secondary | ICD-10-CM | POA: Diagnosis not present

## 2019-08-19 DIAGNOSIS — Z1329 Encounter for screening for other suspected endocrine disorder: Secondary | ICD-10-CM

## 2019-08-19 DIAGNOSIS — Z0000 Encounter for general adult medical examination without abnormal findings: Secondary | ICD-10-CM

## 2019-08-19 DIAGNOSIS — R748 Abnormal levels of other serum enzymes: Secondary | ICD-10-CM

## 2019-08-19 DIAGNOSIS — Z1389 Encounter for screening for other disorder: Secondary | ICD-10-CM

## 2019-08-19 DIAGNOSIS — E559 Vitamin D deficiency, unspecified: Secondary | ICD-10-CM

## 2019-08-19 LAB — CBC WITH DIFFERENTIAL/PLATELET
Basophils Absolute: 0 10*3/uL (ref 0.0–0.1)
Basophils Relative: 1 % (ref 0.0–3.0)
Eosinophils Absolute: 0.1 10*3/uL (ref 0.0–0.7)
Eosinophils Relative: 3 % (ref 0.0–5.0)
HCT: 41.7 % (ref 36.0–46.0)
Hemoglobin: 13.8 g/dL (ref 12.0–15.0)
Lymphocytes Relative: 24.6 % (ref 12.0–46.0)
Lymphs Abs: 1.2 10*3/uL (ref 0.7–4.0)
MCHC: 33.1 g/dL (ref 30.0–36.0)
MCV: 90.8 fl (ref 78.0–100.0)
Monocytes Absolute: 0.3 10*3/uL (ref 0.1–1.0)
Monocytes Relative: 6.5 % (ref 3.0–12.0)
Neutro Abs: 3.2 10*3/uL (ref 1.4–7.7)
Neutrophils Relative %: 64.9 % (ref 43.0–77.0)
Platelets: 219 10*3/uL (ref 150.0–400.0)
RBC: 4.59 Mil/uL (ref 3.87–5.11)
RDW: 14.4 % (ref 11.5–15.5)
WBC: 4.9 10*3/uL (ref 4.0–10.5)

## 2019-08-19 LAB — LIPID PANEL
Cholesterol: 171 mg/dL (ref 0–200)
HDL: 58.9 mg/dL (ref >39.00)
LDL Cholesterol: 98 mg/dL (ref 0–99)
NonHDL: 112.56
Total CHOL/HDL Ratio: 3
Triglycerides: 71 mg/dL (ref 0.0–149.0)
VLDL: 14.2 mg/dL (ref 0.0–40.0)

## 2019-08-19 LAB — URINALYSIS, ROUTINE W REFLEX MICROSCOPIC
Bacteria, UA: NONE SEEN /HPF
Bilirubin Urine: NEGATIVE
Glucose, UA: NEGATIVE
Hgb urine dipstick: NEGATIVE
Hyaline Cast: NONE SEEN /LPF
Ketones, ur: NEGATIVE
Nitrite: NEGATIVE
Protein, ur: NEGATIVE
Specific Gravity, Urine: 1.021 (ref 1.001–1.03)
pH: 7 (ref 5.0–8.0)

## 2019-08-19 LAB — VITAMIN D 25 HYDROXY (VIT D DEFICIENCY, FRACTURES): VITD: 60.87 ng/mL (ref 30.00–100.00)

## 2019-08-19 LAB — COMPREHENSIVE METABOLIC PANEL
ALT: 71 U/L — ABNORMAL HIGH (ref 0–35)
AST: 40 U/L — ABNORMAL HIGH (ref 0–37)
Albumin: 4.4 g/dL (ref 3.5–5.2)
Alkaline Phosphatase: 59 U/L (ref 39–117)
BUN: 21 mg/dL (ref 6–23)
CO2: 29 mEq/L (ref 19–32)
Calcium: 9.5 mg/dL (ref 8.4–10.5)
Chloride: 106 mEq/L (ref 96–112)
Creatinine, Ser: 0.72 mg/dL (ref 0.40–1.20)
GFR: 85.12 mL/min (ref >60.00)
Glucose, Bld: 86 mg/dL (ref 70–99)
Potassium: 4.5 mEq/L (ref 3.5–5.1)
Sodium: 138 mEq/L (ref 135–145)
Total Bilirubin: 0.5 mg/dL (ref 0.2–1.2)
Total Protein: 7 g/dL (ref 6.0–8.3)

## 2019-08-19 LAB — TSH: TSH: 2.5 u[IU]/mL (ref 0.35–4.50)

## 2019-08-19 LAB — T4, FREE: Free T4: 0.75 ng/dL (ref 0.60–1.60)

## 2019-08-23 ENCOUNTER — Other Ambulatory Visit: Payer: Self-pay | Admitting: Obstetrics & Gynecology

## 2019-08-23 DIAGNOSIS — Z1231 Encounter for screening mammogram for malignant neoplasm of breast: Secondary | ICD-10-CM

## 2019-08-30 ENCOUNTER — Ambulatory Visit
Admission: RE | Admit: 2019-08-30 | Discharge: 2019-08-30 | Disposition: A | Discharge status: Home / Self Care | Payer: Managed Care, Other (non HMO) | Source: Ambulatory Visit | Attending: Internal Medicine | Admitting: Internal Medicine

## 2019-08-30 ENCOUNTER — Other Ambulatory Visit: Payer: Self-pay

## 2019-08-30 DIAGNOSIS — R748 Abnormal levels of other serum enzymes: Secondary | ICD-10-CM

## 2019-10-20 ENCOUNTER — Other Ambulatory Visit: Payer: Self-pay

## 2019-10-20 ENCOUNTER — Ambulatory Visit
Admission: RE | Admit: 2019-10-20 | Discharge: 2019-10-20 | Disposition: A | Discharge status: Home / Self Care | Payer: Managed Care, Other (non HMO) | Source: Ambulatory Visit | Attending: Obstetrics & Gynecology | Admitting: Obstetrics & Gynecology

## 2019-10-20 DIAGNOSIS — Z1231 Encounter for screening mammogram for malignant neoplasm of breast: Secondary | ICD-10-CM | POA: Insufficient documentation

## 2019-12-06 ENCOUNTER — Ambulatory Visit: Payer: Managed Care, Other (non HMO) | Admitting: Internal Medicine

## 2019-12-09 ENCOUNTER — Other Ambulatory Visit: Payer: Self-pay

## 2019-12-09 ENCOUNTER — Ambulatory Visit: Payer: Managed Care, Other (non HMO) | Admitting: Internal Medicine

## 2019-12-09 ENCOUNTER — Encounter: Payer: Self-pay | Admitting: Internal Medicine

## 2019-12-09 VITALS — BP 124/82 | HR 64 | Temp 98.2°F | Ht 62.0 in | Wt 148.8 lb

## 2019-12-09 DIAGNOSIS — T753XXA Motion sickness, initial encounter: Secondary | ICD-10-CM

## 2019-12-09 DIAGNOSIS — Z0000 Encounter for general adult medical examination without abnormal findings: Secondary | ICD-10-CM | POA: Diagnosis not present

## 2019-12-09 DIAGNOSIS — Z1389 Encounter for screening for other disorder: Secondary | ICD-10-CM

## 2019-12-09 DIAGNOSIS — R748 Abnormal levels of other serum enzymes: Secondary | ICD-10-CM

## 2019-12-09 DIAGNOSIS — Z13220 Encounter for screening for lipoid disorders: Secondary | ICD-10-CM

## 2019-12-09 DIAGNOSIS — Z1329 Encounter for screening for other suspected endocrine disorder: Secondary | ICD-10-CM

## 2019-12-09 LAB — HEPATIC FUNCTION PANEL
ALT: 22 U/L (ref 0–35)
AST: 22 U/L (ref 0–37)
Albumin: 4.3 g/dL (ref 3.5–5.2)
Alkaline Phosphatase: 56 U/L (ref 39–117)
Bilirubin, Direct: 0.1 mg/dL (ref 0.0–0.3)
Total Bilirubin: 0.4 mg/dL (ref 0.2–1.2)
Total Protein: 6.7 g/dL (ref 6.0–8.3)

## 2019-12-09 MED ORDER — SCOPOLAMINE 1 MG/3DAYS TD PT72: 1.0000 | MEDICATED_PATCH | TRANSDERMAL | 0 refills | Status: DC

## 2019-12-09 NOTE — Patient Instructions (Addendum)
seaband bracelet  -[facility]  Scopolamine skin patches What is this medicine? SCOPOLAMINE (skoe POL a meen) is used to prevent nausea and vomiting caused by motion sickness, anesthesia and surgery. This medicine may be used for other purposes; ask your health care provider or pharmacist if you have questions. COMMON BRAND NAME(S): Transderm Scop What should I tell my health care provider before I take this medicine? They need to know if you have any of these conditions:  are scheduled to have a gastric secretion test  glaucoma  heart disease  kidney disease  liver disease  lung or breathing disease, like asthma  mental illness  prostate disease  seizures  stomach or intestine problems  trouble passing urine  an unusual or allergic reaction to scopolamine, atropine, other medicines, foods, dyes, or preservatives  pregnant or trying to get pregnant  breast-feeding How should I use this medicine? This medicine is for external use only. Follow the directions on the prescription label. Wear only 1 patch at a time. Choose an area behind the ear, that is clean, dry, hairless and free from any cuts or irritation. Wipe the area with a clean dry tissue. Peel off the plastic backing of the skin patch, trying not to touch the adhesive side with your hands. Do not cut the patches. Firmly apply to the area you have chosen, with the metallic side of the patch to the skin and the tan-colored side showing. Once firmly in place, wash your hands well with soap and water. Do not get this medicine into your eyes. After removing the patch, wash your hands and the area behind your ear thoroughly with soap and water. The patch will still contain some medicine after use. To avoid accidental contact or ingestion by children or pets, fold the used patch in half with the sticky side together and throw away in the trash out of the reach of children and pets. If you need to use a second patch after you  remove the first, place it behind the other ear. A special MedGuide will be given to you by the pharmacist with each prescription and refill. Be sure to read this information carefully each time. Talk to your pediatrician regarding the use of this medicine in children. Special care may be needed. Overdosage: If you think you have taken too much of this medicine contact a poison control center or emergency room at once. NOTE: This medicine is only for you. Do not share this medicine with others. What if I miss a dose? This does not apply. This medicine is not for regular use. What may interact with this medicine?  alcohol  antihistamines for allergy cough and cold  atropine  certain medicines for anxiety or sleep  certain medicines for bladder problems like oxybutynin, tolterodine  certain medicines for depression like amitriptyline, fluoxetine, sertraline  certain medicines for stomach problems like dicyclomine, hyoscyamine  certain medicines for Parkinson's disease like benztropine, trihexyphenidyl  certain medicines for seizures like phenobarbital, primidone  general anesthetics like halothane, isoflurane, methoxyflurane, propofol  ipratropium  local anesthetics like lidocaine, pramoxine, tetracaine  medicines that relax muscles for surgery  phenothiazines like chlorpromazine, mesoridazine, prochlorperazine, thioridazine  narcotic medicines for pain  other belladonna alkaloids This list may not describe all possible interactions. Give your health care provider a list of all the medicines, herbs, non-prescription drugs, or dietary supplements you use. Also tell them if you smoke, drink alcohol, or use illegal drugs. Some items may interact with your medicine. What should  I watch for while using this medicine? Limit contact with water while swimming and bathing because the patch may fall off. If the patch falls off, throw it away and put a new one behind the other ear. You  may get drowsy or dizzy. Do not drive, use machinery, or do anything that needs mental alertness until you know how this medicine affects you. Do not stand or sit up quickly, especially if you are an older patient. This reduces the risk of dizzy or fainting spells. Alcohol may interfere with the effect of this medicine. Avoid alcoholic drinks. Your mouth may get dry. Chewing sugarless gum or sucking hard candy, and drinking plenty of water may help. Contact your healthcare professional if the problem does not go away or is severe. This medicine may cause dry eyes and blurred vision. If you wear contact lenses, you may feel some discomfort. Lubricating drops may help. See your healthcare professional if the problem does not go away or is severe. If you are going to need surgery, an MRI, CT scan, or other procedure, tell your healthcare professional that you are using this medicine. You may need to remove the patch before the procedure. What side effects may I notice from receiving this medicine? Side effects that you should report to your doctor or health care professional as soon as possible:  allergic reactions like skin rash, itching or hives; swelling of the face, lips, or tongue  blurred vision  changes in vision  confusion  dizziness  eye pain  fast, irregular heartbeat  hallucinations, loss of contact with reality  nausea, vomiting  pain or trouble passing urine  restlessness  seizures  skin irritation  stomach pain Side effects that usually do not require medical attention (report to your doctor or health care professional if they continue or are bothersome):  drowsiness  dry mouth  headache  sore throat This list may not describe all possible side effects. Call your doctor for medical advice about side effects. You may report side effects to FDA at 1-800-FDA-1088. Where should I keep my medicine? Keep out of the reach of children. Store at room temperature  between 20 and 25 degrees C (68 and 77 degrees F). Keep this medicine in the foil package until ready to use. Throw away any unused medicine after the expiration date. NOTE: This sheet is a summary. It may not cover all possible information. If you have questions about this medicine, talk to your doctor, pharmacist, or health care provider.  2020 Elsevier/Gold Standard (2017-05-08 16:14:46)  Motion Sickness Motion sickness is an unpleasant, temporary feeling of dizziness and nausea that may occur when you are traveling in a moving vehicle. You may also have abdominal pain, sweating, and paleness. You may experience motion sickness while riding in a boat, car, airplane, or even an amusement park ride. What are the causes? This condition may be caused by overstimulation of part of the inner ear (semicircular canal). The two semicircular canals help you keep your balance by sending signals to your brain about movement. You can get motion sickness if:  The semicircular canals are stimulated too much from the motion of your body.  Your brain gets conflicting signals from the various motion sensors in your body. The effects of motion sickness may be increased by stress, dehydration, other illnesses, or drinking too much alcohol. What increases the risk? This condition is more likely to develop in:  Children who are 55-55 years old.  Women, especially those who are  pregnant or taking birth control pills.  People who get migraine headaches.  People who have inner ear disorders.  People who take certain medicines. What are the signs or symptoms? Symptoms of this condition include:  Nausea.  Dizziness.  Vomiting.  Sweating.  Abdominal pain.  Being unsteady when walking.  Paleness. The symptoms of motion sickness usually get better after the motion or traveling stops, but problems may last for hours or days. How is this diagnosed? This condition is diagnosed based on a physical exam,  your medical history, and your description of the symptoms that you have while traveling or moving. How is this treated? For most people, symptoms fade quickly after the motion stops. Your health care provider may recommend or prescribe medicine to help prevent motion sickness. Medicine may be in the form of a patch that is placed behind your ear. Avoiding certain things or using certain techniques before or during travel can help prevent episodes of motion sickness. Follow these instructions at home: Medicines  Take or use over-the-counter and prescription medicines only as told by your health care provider.  If you use a motion sickness patch, wash your hands right after you put the patch on. Touching your hands to your eyes after using the patch can enlarge (dilate) your pupils for 1-2 days and disturb your vision. Eating and drinking   Drink enough fluid to keep your urine pale yellow. Take small, frequent sips of liquids as needed while experiencing motion sickness. Staying hydrated may help relieve or prevent symptoms.  Do not eat large meals before or during travel. When you travel long distances, eat small, bland meals.  Do not drink alcohol before or during travel. When riding in a moving vehicle:  Sit in an area of the vehicle where the least motion occurs. ? On an airplane, sit near the wing. Lie back in your seat if possible. ? On a boat, sit near the middle. ? In a car, sit in the front seat, not the back seat.  Breathe slowly and deeply.  Do not read or focus on nearby objects such as your phone.  Try watching the horizon or a distant object. This is especially helpful when you travel in a boat. In a car, ride in the front seat and look out the front window.  Get some fresh air if possible. For example, open a window when you are riding in a car. General instructions  If possible, avoid situations that cause your motion sickness.  Do not smoke before or during  travel. Avoid areas where people are smoking.  Plan ahead for travel. Talk with your health care provider about whether you should use medicines to help prevent motion sickness. Contact a health care provider if:  Your vomiting or nausea does not go away within 24 hours.  You have blood in your vomit. The blood may be dark red, or it may look like coffee grounds.  You faint.  You have severe dizziness or light-headedness when you stand up.  You have a fever. Get help right away if:  You have severe pain in your abdomen or chest.  You have trouble breathing.  You have a severe headache.  You develop weakness or numbness on one side of your body.  You have trouble speaking. Summary  Motion sickness is an unpleasant, temporary feeling of dizziness and nausea that occurs when traveling in a moving vehicle.  You may also have abdominal pain, sweating, and paleness.  The symptoms of  motion sickness usually get better after the motion or traveling stops, but problems may last for hours or days.  Plan ahead for travel. Talk with your health care provider about whether you should use medicines to help prevent motion sickness. This information is not intended to replace advice given to you by your health care provider. Make sure you discuss any questions you have with your health care provider. Document Revised: 01/30/2017 Document Reviewed: 11/27/2016 Elsevier Patient Education  2020 [employer].

## 2019-12-09 NOTE — Progress Notes (Signed)
Chief Complaint  Patient presents with   Follow-up   Annual  1. Going on cruise and gets motion sickness cruise sch 02/2020 with family and agreeable to try scopolamine patch  2. lfts elevated last time checked but she just returned from girls trip and had etoh which she does not do frequently  3. Stressors recently placed mother in law in nursing home she was too much to care at home due to dementia  Review of Systems  Constitutional: Negative for weight loss.  HENT: Negative for hearing loss.   Eyes: Negative for blurred vision.  Respiratory: Negative for shortness of breath.   Cardiovascular: Negative for chest pain.  Gastrointestinal: Negative for abdominal pain.  Musculoskeletal: Negative for falls.  Skin: Negative for rash.  Neurological: Negative for headaches.  Psychiatric/Behavioral: Negative for depression. The patient is not nervous/anxious.    Past Medical History:  Diagnosis Date   Allergies    Colon polyps    Decreased sex drive    Depression    GERD (gastroesophageal reflux disease)    HLD (hyperlipidemia)    Hypercholesteremia    Osteopenia    Premature ovarian failure    UTI (urinary tract infection)    Past Surgical History:  Procedure Laterality Date   ABDOMINAL HYSTERECTOMY     lsh   CHOLECYSTECTOMY     TONSILLECTOMY AND ADENOIDECTOMY     Family History  Problem Relation Age of Onset   Osteoporosis Mother    Diabetes Mother    Depression Mother    Diabetes gravidarum Mother    Heart attack Mother    Hypertension Mother    Hyperlipidemia Mother    Mental illness Mother    Heart disease Maternal Grandfather    Drug abuse Father    Hearing loss Father    Hyperlipidemia Father    Alcohol abuse Brother    Drug abuse Brother    Cancer Neg Hx    Ovarian cancer Neg Hx    Colon polyps Neg Hx    Breast cancer Neg Hx    Social History   Socioeconomic History   Marital status: Married    Spouse name: Not on  file   Number of children: Not on file   Years of education: Not on file   Highest education level: Not on file  Occupational History   Not on file  Tobacco Use   Smoking status: Never Smoker   Smokeless tobacco: Never Used  Vaping Use   Vaping Use: Never used  Substance and Sexual Activity   Alcohol use: Yes    Comment: social   Drug use: No   Sexual activity: Yes    Birth control/protection: Surgical  Other Topics Concern   Not on file  Social History Narrative   Not on file   Social Determinants of Health   Financial Resource Strain:    Difficulty of Paying Living Expenses: Not on file  Food Insecurity:    Worried About Running Out of Food in the Last Year: Not on file   [employer] of Food in the Last Year: Not on file  Transportation Needs:    Lack of Transportation (Medical): Not on file   Lack of Transportation (Non-Medical): Not on file  Physical Activity:    Days of Exercise per Week: Not on file   Minutes of Exercise per Session: Not on file  Stress:    Feeling of Stress : Not on file  Social Connections:    Frequency of Communication  with Friends and Family: Not on file   Frequency of Social Gatherings with Friends and Family: Not on file   Attends Religious Services: Not on file   Active Member of Clubs or Organizations: Not on file   Attends Archivist Meetings: Not on file   Marital Status: Not on file  Intimate Partner Violence:    Fear of Current or Ex-Partner: Not on file   Emotionally Abused: Not on file   Physically Abused: Not on file   Sexually Abused: Not on file   Current Meds  Medication Sig   calcium-vitamin D 250-100 MG-UNIT tablet Take 1 tablet by mouth 2 (two) times daily.   estradiol (VIVELLE-DOT) 0.1 MG/24HR patch Place 1 patch (0.1 mg total) onto the skin 2 (two) times a week.   No Known Allergies Recent Results (from the past 2160 hour(s))  Hepatic function panel     Status: None    Collection Time: 12/09/19 12:39 PM  Result Value Ref Range   Total Bilirubin 0.4 0.2 - 1.2 mg/dL   Bilirubin, Direct 0.1 0.0 - 0.3 mg/dL   Alkaline Phosphatase 56 39 - 117 U/L   AST 22 0 - 37 U/L   ALT 22 0 - 35 U/L   Total Protein 6.7 6.0 - 8.3 g/dL   Albumin 4.3 3.5 - 5.2 g/dL   Objective  Body mass index is 27.22 kg/m. Wt Readings from Last 3 Encounters:  12/09/19 148 lb 12.8 oz (67.5 kg)  12/03/18 168 lb 9.6 oz (76.5 kg)  08/19/17 165 lb 8 oz (75.1 kg)   Temp Readings from Last 3 Encounters:  12/09/19 98.2 F (36.8 C) (Oral)  12/03/18 98 F (36.7 C) (Oral)   BP Readings from Last 3 Encounters:  12/09/19 124/82  12/03/18 128/78  08/19/17 122/71   Pulse Readings from Last 3 Encounters:  12/09/19 64  12/03/18 73  08/19/17 66    Physical Exam Vitals and nursing note reviewed.  Constitutional:      Appearance: Normal appearance. She is well-developed, well-groomed and overweight.  HENT:     Head: Normocephalic and atraumatic.  Eyes:     Conjunctiva/sclera: Conjunctivae normal.     Pupils: Pupils are equal, round, and reactive to light.  Cardiovascular:     Rate and Rhythm: Normal rate and regular rhythm.     Heart sounds: Normal heart sounds. No murmur heard.   Pulmonary:     Effort: Pulmonary effort is normal.     Breath sounds: Normal breath sounds.  Abdominal:     General: Abdomen is flat. Bowel sounds are normal.     Tenderness: There is no abdominal tenderness.  Skin:    General: Skin is warm and dry.  Neurological:     General: No focal deficit present.     Mental Status: She is alert and oriented to person, place, and time. Mental status is at baseline.     Gait: Gait normal.  Psychiatric:        Attention and Perception: Attention and perception normal.        Mood and Affect: Mood and affect normal.        Speech: Speech normal.        Behavior: Behavior normal. Behavior is cooperative.        Thought Content: Thought content normal.         Cognition and Memory: Cognition and memory normal.        Judgment: Judgment normal.  Assessment  Plan  Annual physical exam Reviewed labs 08/19/19 repeat lfts today Declines flu shot  tdap given utd  covid 2/2 consider booster  Disc shingrix in future  S/p hyst endometriosis pap 2018 negative pap neg hpv still have both ovaries  Dr. [facility] ob/g[facility]   mammo 10/20/19 negative dexa 10/19/18 neg  Colonoscopy 11/19/17 requested records [facility]  H/o polyp 50 due 57 colonoscopy consider [facility]   Derm Dr. Kellie Moor  Summer 2021 negative f/u 1 year   [facility] [place] Dr. Arita Miss  rec healthy diet and exercise  rec D3 2000 Iu qd   H/o covid + summer 2020    Elevated liver enzymes - Plan: Hepatic function panel  Motion sickness, initial encounter - Plan: scopolamine (TRANSDERM-SCOP, 1.5 MG,) 1 MG/3DAYS    Provider: Dr. Olivia Mackie McLean-[facility]oc[facility]

## 2019-12-12 ENCOUNTER — Encounter: Payer: Self-pay | Admitting: Internal Medicine

## 2019-12-12 DIAGNOSIS — T753XXA Motion sickness, initial encounter: Secondary | ICD-10-CM | POA: Insufficient documentation

## 2020-05-08 ENCOUNTER — Encounter: Payer: Self-pay | Admitting: Internal Medicine

## 2020-05-08 ENCOUNTER — Ambulatory Visit: Payer: Managed Care, Other (non HMO) | Admitting: Internal Medicine

## 2020-05-08 ENCOUNTER — Other Ambulatory Visit: Payer: Self-pay

## 2020-05-08 VITALS — BP 138/86 | HR 98 | Temp 97.6°F | Ht 62.0 in | Wt 148.4 lb

## 2020-05-08 DIAGNOSIS — K5792 Diverticulitis of intestine, part unspecified, without perforation or abscess without bleeding: Secondary | ICD-10-CM

## 2020-05-08 DIAGNOSIS — R10A0 Flank pain, unspecified side: Secondary | ICD-10-CM

## 2020-05-08 DIAGNOSIS — Z1231 Encounter for screening mammogram for malignant neoplasm of breast: Secondary | ICD-10-CM

## 2020-05-08 DIAGNOSIS — R1032 Left lower quadrant pain: Secondary | ICD-10-CM | POA: Diagnosis not present

## 2020-05-08 DIAGNOSIS — R109 Unspecified abdominal pain: Secondary | ICD-10-CM

## 2020-05-08 DIAGNOSIS — R933 Abnormal findings on diagnostic imaging of other parts of digestive tract: Secondary | ICD-10-CM

## 2020-05-08 DIAGNOSIS — N3000 Acute cystitis without hematuria: Secondary | ICD-10-CM | POA: Diagnosis not present

## 2020-05-08 DIAGNOSIS — R110 Nausea: Secondary | ICD-10-CM

## 2020-05-08 MED ORDER — CIPROFLOXACIN HCL 500 MG PO TABS: 500.0000 mg | ORAL_TABLET | Freq: Two times a day (BID) | ORAL | 0 refills | Status: DC

## 2020-05-08 MED ORDER — ONDANSETRON HCL 4 MG PO TABS: 4.0000 mg | ORAL_TABLET | Freq: Three times a day (TID) | ORAL | 0 refills | Status: DC | PRN

## 2020-05-08 MED ORDER — METRONIDAZOLE 500 MG PO TABS: 500.0000 mg | ORAL_TABLET | Freq: Two times a day (BID) | ORAL | 0 refills | Status: DC

## 2020-05-08 MED ORDER — DICYCLOMINE HCL 10 MG PO CAPS: 10.0000 mg | ORAL_CAPSULE | Freq: Three times a day (TID) | ORAL | 0 refills | Status: DC

## 2020-05-08 NOTE — Patient Instructions (Signed)
Diverticulitis  Diverticulitis is infection or inflammation of small pouches (diverticula) in the colon that form due to a condition called diverticulosis. Diverticula can trap stool (feces) and bacteria, causing infection and inflammation. Diverticulitis may cause severe stomach pain and diarrhea. It may lead to tissue damage in the colon that causes bleeding or blockage. The diverticula may also burst (rupture) and cause infected stool to enter other areas of the abdomen. What are the causes? This condition is caused by stool becoming trapped in the diverticula, which allows bacteria to grow in the diverticula. This leads to inflammation and infection. What increases the risk? You are more likely to develop this condition if you have diverticulosis. The risk increases if you:  Are overweight or obese.  Do not get enough exercise.  Drink alcohol.  Use tobacco products.  Eat a diet that has a lot of red meat such as beef, pork, or lamb.  Eat a diet that does not include enough fiber. High-fiber foods include fruits, vegetables, beans, nuts, and whole grains.  Are over 40 years of age. What are the signs or symptoms? Symptoms of this condition may include:  Pain and tenderness in the abdomen. The pain is normally located on the left side of the abdomen, but it may occur in other areas.  Fever and chills.  Nausea.  Vomiting.  Cramping.  Bloating.  Changes in bowel routines.  Blood in your stool. How is this diagnosed? This condition is diagnosed based on:  Your medical history.  A physical exam.  Tests to make sure there is nothing else causing your condition. These tests may include: ? Blood tests. ? Urine tests. ? CT scan of the abdomen. How is this treated? Most cases of this condition are mild and can be treated at home. Treatment may include:  Taking over-the-counter pain medicines.  Following a clear liquid diet.  Taking antibiotic medicines by  mouth.  Resting. More severe cases may need to be treated at a hospital. Treatment may include:  Not eating or drinking.  Taking prescription pain medicine.  Receiving antibiotic medicines through an IV.  Receiving fluids and nutrition through an IV.  Surgery. When your condition is under control, your health care provider may recommend that you have a colonoscopy. This is an exam to look at the entire large intestine. During the exam, a lubricated, bendable tube is inserted into the anus and then passed into the rectum, colon, and other parts of the large intestine. A colonoscopy can show how severe your diverticula are and whether something else may be causing your symptoms. Follow these instructions at home: Medicines  Take over-the-counter and prescription medicines only as told by your health care provider. These include fiber supplements, probiotics, and stool softeners.  If you were prescribed an antibiotic medicine, take it as told by your health care provider. Do not stop taking the antibiotic even if you start to feel better.  Ask your health care provider if the medicine prescribed to you requires you to avoid driving or using machinery. Eating and drinking  Follow a full liquid diet or another diet as directed by your health care provider.  After your symptoms improve, your health care provider may tell you to change your diet. He or she may recommend that you eat a diet that contains at least 25 grams (25 g) of fiber daily. Fiber makes it easier to pass stool. Healthy sources of fiber include: ? Berries. One cup contains 4-8 grams of fiber. ? Beans   or lentils. One-half cup contains 5-8 grams of fiber. ? Green vegetables. One cup contains 4 grams of fiber.  Avoid eating red meat.   General instructions  Do not use any products that contain nicotine or tobacco, such as cigarettes, e-cigarettes, and chewing tobacco. If you need help quitting, ask your health care  provider.  Exercise for at least 30 minutes, 3 times each week. You should exercise hard enough to raise your heart rate and break a sweat.  Keep all follow-up visits as told by your health care provider. This is important. You may need to have a colonoscopy. Contact a health care provider if:  Your pain does not improve.  Your bowel movements do not return to normal. Get help right away if:  Your pain gets worse.  Your symptoms do not get better with treatment.  Your symptoms suddenly get worse.  You have a fever.  You vomit more than one time.  You have stools that are bloody, black, or tarry. Summary  Diverticulitis is infection or inflammation of small pouches (diverticula) in the colon that form due to a condition called diverticulosis. Diverticula can trap stool (feces) and bacteria, causing infection and inflammation.  You are at higher risk for this condition if you have diverticulosis and you eat a diet that does not include enough fiber.  Most cases of this condition are mild and can be treated at home. More severe cases may need to be treated at a hospital.  When your condition is under control, your health care provider may recommend that you have an exam called a colonoscopy. This exam can show how severe your diverticula are and whether something else may be causing your symptoms.  Keep all follow-up visits as told by your health care provider. This is important. This information is not intended to replace advice given to you by your health care provider. Make sure you discuss any questions you have with your health care provider. Document Revised: 11/29/2018 Document Reviewed: 11/29/2018 Elsevier Patient Education  2021 Elsevier Inc.  

## 2020-05-08 NOTE — Progress Notes (Addendum)
Chief Complaint  Patient presents with  . Urinary Tract Infection    Cramping & urgency for 1 week. No burning with urination, pt states she's drinking lots of water.    F/u  1. >1 week llq, lmq ab pain and cramping at times feels hard and bloated but able to pass gas tried otc gas and constipation meds and no change FH diverticulitis and this has been recurrent pain over years she she wonders if she has diverticulitis. She has increased urinary freq no dysuria. Lying flat is worse and it cramps up at times has b/l back/flank pain. Tried Citracel otc w/o help. Feels like someone punch her in the stomach pain is 5/10 today constant. She already drinks a lot water. She also had had nausea  Review of Systems  Constitutional: Negative for weight loss.  HENT: Negative for hearing loss.   Respiratory: Negative for shortness of breath.   Cardiovascular: Negative for chest pain.  Gastrointestinal: Positive for abdominal pain and nausea. Negative for constipation and diarrhea.  Genitourinary: Positive for frequency. Negative for dysuria.  Skin: Negative for rash.   Past Medical History:  Diagnosis Date  . Allergies   . Colon polyps   . COVID-19    summer 2020, xmas 2021  . Decreased sex drive   . Depression   . GERD (gastroesophageal reflux disease)   . HLD (hyperlipidemia)   . Hypercholesteremia   . Osteopenia   . Premature ovarian failure   . UTI (urinary tract infection)    Past Surgical History:  Procedure Laterality Date  . ABDOMINAL HYSTERECTOMY     lsh  . CHOLECYSTECTOMY    . TONSILLECTOMY AND ADENOIDECTOMY     Family History  Problem Relation Age of Onset  . Osteoporosis Mother   . Diabetes Mother   . Depression Mother   . Diabetes gravidarum Mother   . Heart attack Mother   . Hypertension Mother   . Hyperlipidemia Mother   . Mental illness Mother   . Heart disease Maternal Grandfather   . Drug abuse Father   . Hearing loss Father   . Hyperlipidemia Father   .  Alcohol abuse Brother   . Drug abuse Brother   . Diverticulitis Brother   . Diverticulitis Maternal Aunt   . Cancer Neg Hx   . Ovarian cancer Neg Hx   . Colon polyps Neg Hx   . Breast cancer Neg Hx    Social History   Socioeconomic History  . Marital status: Married    Spouse name: Not on file  . Number of children: Not on file  . Years of education: Not on file  . Highest education level: Not on file  Occupational History  . Not on file  Tobacco Use  . Smoking status: Never Smoker  . Smokeless tobacco: Never Used  Vaping Use  . Vaping Use: Never used  Substance and Sexual Activity  . Alcohol use: Yes    Comment: social  . Drug use: No  . Sexual activity: Yes    Birth control/protection: Surgical  Other Topics Concern  . Not on file  Social History Narrative  . Not on file   Social Determinants of Health   Financial Resource Strain: Not on file  Food Insecurity: Not on file  Transportation Needs: Not on file  Physical Activity: Not on file  Stress: Not on file  Social Connections: Not on file  Intimate Partner Violence: Not on file   Current Meds  Medication Sig  .  calcium-vitamin D 250-100 MG-UNIT tablet Take 1 tablet by mouth 2 (two) times daily.  . ciprofloxacin (CIPRO) 500 MG tablet Take 1 tablet (500 mg total) by mouth 2 (two) times daily. With food  . dicyclomine (BENTYL) 10 MG capsule Take 1 capsule (10 mg total) by mouth 4 (four) times daily -  before meals and at bedtime.  Marland Kitchen estradiol (VIVELLE-DOT) 0.1 MG/24HR patch Place 1 patch (0.1 mg total) onto the skin 2 (two) times a week.  . metroNIDAZOLE (FLAGYL) 500 MG tablet Take 1 tablet (500 mg total) by mouth 2 (two) times daily. With food  . ondansetron (ZOFRAN) 4 MG tablet Take 1 tablet (4 mg total) by mouth every 8 (eight) hours as needed for nausea or vomiting.  Marland Kitchen scopolamine (TRANSDERM-SCOP, 1.5 MG,) 1 MG/3DAYS Place 1 patch (1.5 mg total) onto the skin every 3 (three) days. Place behind 4 hours before  boarding ship   No Known Allergies No results found for this or any previous visit (from the past 2160 hour(s)). Objective  Body mass index is 27.14 kg/m. Wt Readings from Last 3 Encounters:  05/08/20 148 lb 6 oz (67.3 kg)  12/09/19 148 lb 12.8 oz (67.5 kg)  12/03/18 168 lb 9.6 oz (76.5 kg)   Temp Readings from Last 3 Encounters:  05/08/20 97.6 F (36.4 C)  12/09/19 98.2 F (36.8 C) (Oral)  12/03/18 98 F (36.7 C) (Oral)   BP Readings from Last 3 Encounters:  05/08/20 138/86  12/09/19 124/82  12/03/18 128/78   Pulse Readings from Last 3 Encounters:  05/08/20 98  12/09/19 64  12/03/18 73    Physical Exam Vitals and nursing note reviewed.  Constitutional:      Appearance: Normal appearance. She is well-developed and well-groomed.  HENT:     Head: Normocephalic and atraumatic.  Cardiovascular:     Rate and Rhythm: Normal rate and regular rhythm.     Heart sounds: Normal heart sounds. No murmur heard.   Pulmonary:     Effort: Pulmonary effort is normal.     Breath sounds: Normal breath sounds.  Abdominal:     Tenderness: There is abdominal tenderness in the suprapubic area and left lower quadrant. There is no right CVA tenderness or left CVA tenderness.  Skin:    General: Skin is warm and dry.  Neurological:     General: No focal deficit present.     Mental Status: She is alert and oriented to person, place, and time. Mental status is at baseline.     Gait: Gait normal.  Psychiatric:        Attention and Perception: Attention and perception normal.        Mood and Affect: Mood and affect normal.        Speech: Speech normal.        Behavior: Behavior normal. Behavior is cooperative.        Thought Content: Thought content normal.        Cognition and Memory: Cognition and memory normal.        Judgment: Judgment normal.    EXAM: CT ABDOMEN AND PELVIS WITH CONTRAST  TECHNIQUE: Multidetector CT imaging of the abdomen and pelvis was performed using the  standard protocol following bolus administration of intravenous contrast.  CONTRAST:  155mL OMNIPAQUE IOHEXOL 300 MG/ML  SOLN  COMPARISON:  Abdominal ultrasound August 30, 2019  FINDINGS: Lower chest: No acute abnormality. Normal size heart. No pericardial effusion.  Hepatobiliary: No suspicious hepatic lesion. Hepatic parenchyma grossly unremarkable.  Gallbladder surgically absent. No biliary ductal dilation.  Pancreas: Unremarkable.  Spleen: Unremarkable  Adrenals/Urinary Tract: Bilateral adrenal glands are unremarkable.  No hydronephrosis. Symmetric enhancement and excretion of the bilateral kidneys. No suspicious filling defect visualized within the opacified portions of the collecting system and ureter on delayed imaging.  Urinary bladder is grossly unremarkable for degree of distension.  Stomach/Bowel: Enteric contrast visualized to the level of the transverse colon. Small hiatal hernia otherwise the stomach is grossly unremarkable. Normal positioning of the duodenum/ligament of Treitz. No suspicious small bowel wall thickening or dilation. The appendix and terminal ileum are grossly unremarkable. Sigmoid colonic diverticulosis without findings of acute diverticulitis. There is apparent short sigmoid colonic wall thickening on image 59/2.  Vascular/Lymphatic: Aortic atherosclerosis. No enlarged abdominal or pelvic lymph nodes.  Reproductive: Uterus is retroflexed. Lobular uterine contour likely related to leiomyomas. No adnexal lesions.  Other: No abdominopelvic ascites.  Musculoskeletal: Multilevel degenerative changes spine. No acute osseous abnormality. A he  IMPRESSION: 1. Apparent short segment sigmoid colonic wall thickening may be secondary to underdistention. However, given the patient's age and the presence of diverticula in this region, correlation with the patient's colon cancer screening history is recommended. If screening is not  up-to-date, appropriate screening should be considered. 2. Sigmoid colonic diverticulosis without findings of acute diverticulitis. 3. Uterine leiomyomas. 4. Aortic atherosclerosis.  Aortic Atherosclerosis (ICD10-I70.0).   Electronically Signed   By: Dahlia Bailiff MD   On: 05/09/2020 12:16  Assessment  Plan   Llq, lmq ab pain with nausea and cramping and  FH diverticulitis x 1 week r/o diverticulitis vs uti vs stones GU CMET, CBC, UA and culture cipro 500 mg bid x 7 days with flagyl bid x 7 days Prn zofran and bentyl  [facility]er to [facility]   HM Declines flu shot  tdap given utd  covid 2/2 consider booster declines  Disc shingrix in future  S/p hyst endometriosis pap 2018 negative pap neg hpv still have both ovaries  Dr. WARd ob/gyn   mammo 10/20/19 negative ordered  dexa 10/19/18 neg  Colonoscopy 11/19/17 requested records [facility] Dr. Shary Key  H/o polyp 50 due 55 colonoscopy consider [facility]   Derm Dr. Kellie Moor  Summer 2021 negative f/u 1 year   [facility] [place] Dr. Arita Miss  rec healthy diet and exercise  rec D3 2000 Iu qd   H/o covid + summer 2020     Provider: Dr. Olivia Mackie McLean-Scocuzza-[facility]

## 2020-05-09 ENCOUNTER — Ambulatory Visit
Admission: RE | Admit: 2020-05-09 | Discharge: 2020-05-09 | Disposition: A | Discharge status: Home / Self Care | Payer: Managed Care, Other (non HMO) | Source: Ambulatory Visit | Attending: Internal Medicine | Admitting: Internal Medicine

## 2020-05-09 ENCOUNTER — Telehealth: Payer: Self-pay | Admitting: Internal Medicine

## 2020-05-09 DIAGNOSIS — R109 Unspecified abdominal pain: Secondary | ICD-10-CM | POA: Diagnosis present

## 2020-05-09 DIAGNOSIS — R110 Nausea: Secondary | ICD-10-CM | POA: Diagnosis present

## 2020-05-09 DIAGNOSIS — R1032 Left lower quadrant pain: Secondary | ICD-10-CM | POA: Insufficient documentation

## 2020-05-09 LAB — CBC WITH DIFFERENTIAL/PLATELET
Basophils Absolute: 0.1 10*3/uL (ref 0.0–0.1)
Basophils Relative: 0.9 % (ref 0.0–3.0)
Eosinophils Absolute: 0.1 10*3/uL (ref 0.0–0.7)
Eosinophils Relative: 1.6 % (ref 0.0–5.0)
HCT: 40.4 % (ref 36.0–46.0)
Hemoglobin: 13.3 g/dL (ref 12.0–15.0)
Lymphocytes Relative: 23.1 % (ref 12.0–46.0)
Lymphs Abs: 1.4 10*3/uL (ref 0.7–4.0)
MCHC: 32.9 g/dL (ref 30.0–36.0)
MCV: 91.8 fl (ref 78.0–100.0)
Monocytes Absolute: 0.4 10*3/uL (ref 0.1–1.0)
Monocytes Relative: 6.4 % (ref 3.0–12.0)
Neutro Abs: 4.1 10*3/uL (ref 1.4–7.7)
Neutrophils Relative %: 68 % (ref 43.0–77.0)
Platelets: 255 10*3/uL (ref 150.0–400.0)
RBC: 4.4 Mil/uL (ref 3.87–5.11)
RDW: 14.1 % (ref 11.5–15.5)
WBC: 6.1 10*3/uL (ref 4.0–10.5)

## 2020-05-09 LAB — MICROSCOPIC EXAMINATION: Casts: NONE SEEN /lpf

## 2020-05-09 LAB — COMPREHENSIVE METABOLIC PANEL
ALT: 7 U/L (ref 0–35)
AST: 12 U/L (ref 0–37)
Albumin: 4.1 g/dL (ref 3.5–5.2)
Alkaline Phosphatase: 48 U/L (ref 39–117)
BUN: 17 mg/dL (ref 6–23)
CO2: 28 mEq/L (ref 19–32)
Calcium: 9.4 mg/dL (ref 8.4–10.5)
Chloride: 102 mEq/L (ref 96–112)
Creatinine, Ser: 0.74 mg/dL (ref 0.40–1.20)
GFR: 92.94 mL/min (ref >60.00)
Glucose, Bld: 87 mg/dL (ref 70–99)
Potassium: 4 mEq/L (ref 3.5–5.1)
Sodium: 137 mEq/L (ref 135–145)
Total Bilirubin: 0.3 mg/dL (ref 0.2–1.2)
Total Protein: 6.7 g/dL (ref 6.0–8.3)

## 2020-05-09 LAB — URINALYSIS, ROUTINE W REFLEX MICROSCOPIC
Bilirubin, UA: NEGATIVE
Glucose, UA: NEGATIVE
Ketones, UA: NEGATIVE
Nitrite, UA: NEGATIVE
Protein,UA: NEGATIVE
RBC, UA: NEGATIVE
Specific Gravity, UA: 1.02 (ref 1.005–1.030)
Urobilinogen, Ur: 1 mg/dL (ref 0.2–1.0)
pH, UA: 7 (ref 5.0–7.5)

## 2020-05-09 MED ORDER — IOHEXOL 300 MG/ML  SOLN
100.0000 mL | Freq: Once | INTRAMUSCULAR | Status: AC | PRN
  Administered 2020-05-09: 100 mL via INTRAVENOUS

## 2020-05-09 NOTE — Telephone Encounter (Signed)
Needing to request release of information from [facility] in [place] for patient's last colonoscopy.   ROI on file was signed in 2020 and will not be accepted for a 2022 request.   Called and spoke with the Patient she will come in to sign this today. Patient will stop by the front desk.

## 2020-05-09 NOTE — Telephone Encounter (Signed)
Pt came by to sign ROI for [employer]. Placed in folder up front

## 2020-05-10 ENCOUNTER — Encounter: Payer: Self-pay | Admitting: Internal Medicine

## 2020-05-10 DIAGNOSIS — R933 Abnormal findings on diagnostic imaging of other parts of digestive tract: Secondary | ICD-10-CM | POA: Insufficient documentation

## 2020-05-10 DIAGNOSIS — D259 Leiomyoma of uterus, unspecified: Secondary | ICD-10-CM | POA: Insufficient documentation

## 2020-05-10 LAB — URINE CULTURE: Organism ID, Bacteria: NO GROWTH

## 2020-05-10 NOTE — Addendum Note (Signed)
Addended by: Orland Mustard on: 05/10/2020 05:40 PM   Modules accepted: Orders

## 2020-05-10 NOTE — Telephone Encounter (Signed)
I called reviewed labs for patient.

## 2020-05-10 NOTE — Telephone Encounter (Signed)
Patient was returning call for lab results 

## 2020-05-11 ENCOUNTER — Other Ambulatory Visit: Payer: Self-pay | Admitting: Internal Medicine

## 2020-05-11 DIAGNOSIS — R109 Unspecified abdominal pain: Secondary | ICD-10-CM

## 2020-05-22 ENCOUNTER — Ambulatory Visit: Payer: Managed Care, Other (non HMO) | Admitting: Internal Medicine

## 2020-08-20 ENCOUNTER — Other Ambulatory Visit: Payer: Self-pay

## 2020-08-20 ENCOUNTER — Other Ambulatory Visit (INDEPENDENT_AMBULATORY_CARE_PROVIDER_SITE_OTHER): Payer: Managed Care, Other (non HMO)

## 2020-08-20 ENCOUNTER — Other Ambulatory Visit: Payer: Self-pay | Admitting: Internal Medicine

## 2020-08-20 DIAGNOSIS — Z13220 Encounter for screening for lipoid disorders: Secondary | ICD-10-CM | POA: Diagnosis not present

## 2020-08-20 DIAGNOSIS — Z1389 Encounter for screening for other disorder: Secondary | ICD-10-CM

## 2020-08-20 DIAGNOSIS — Z1329 Encounter for screening for other suspected endocrine disorder: Secondary | ICD-10-CM | POA: Diagnosis not present

## 2020-08-20 DIAGNOSIS — Z0000 Encounter for general adult medical examination without abnormal findings: Secondary | ICD-10-CM

## 2020-08-20 DIAGNOSIS — D72819 Decreased white blood cell count, unspecified: Secondary | ICD-10-CM

## 2020-08-20 DIAGNOSIS — E538 Deficiency of other specified B group vitamins: Secondary | ICD-10-CM

## 2020-08-20 LAB — COMPREHENSIVE METABOLIC PANEL
ALT: 9 U/L (ref 0–35)
AST: 13 U/L (ref 0–37)
Albumin: 4.2 g/dL (ref 3.5–5.2)
Alkaline Phosphatase: 42 U/L (ref 39–117)
BUN: 15 mg/dL (ref 6–23)
CO2: 29 mEq/L (ref 19–32)
Calcium: 9.3 mg/dL (ref 8.4–10.5)
Chloride: 104 mEq/L (ref 96–112)
Creatinine, Ser: 0.71 mg/dL (ref 0.40–1.20)
GFR: 97.48 mL/min (ref >60.00)
Glucose, Bld: 89 mg/dL (ref 70–99)
Potassium: 4.2 mEq/L (ref 3.5–5.1)
Sodium: 139 mEq/L (ref 135–145)
Total Bilirubin: 0.4 mg/dL (ref 0.2–1.2)
Total Protein: 6.6 g/dL (ref 6.0–8.3)

## 2020-08-20 LAB — TSH: TSH: 3.6 u[IU]/mL (ref 0.35–4.50)

## 2020-08-20 LAB — CBC WITH DIFFERENTIAL/PLATELET
Basophils Absolute: 0 10*3/uL (ref 0.0–0.1)
Basophils Relative: 1.1 % (ref 0.0–3.0)
Eosinophils Absolute: 0.3 10*3/uL (ref 0.0–0.7)
Eosinophils Relative: 7.4 % — ABNORMAL HIGH (ref 0.0–5.0)
HCT: 39.7 % (ref 36.0–46.0)
Hemoglobin: 13.5 g/dL (ref 12.0–15.0)
Lymphocytes Relative: 30.7 % (ref 12.0–46.0)
Lymphs Abs: 1.1 10*3/uL (ref 0.7–4.0)
MCHC: 33.9 g/dL (ref 30.0–36.0)
MCV: 91.6 fl (ref 78.0–100.0)
Monocytes Absolute: 0.3 10*3/uL (ref 0.1–1.0)
Monocytes Relative: 9.5 % (ref 3.0–12.0)
Neutro Abs: 1.9 10*3/uL (ref 1.4–7.7)
Neutrophils Relative %: 51.3 % (ref 43.0–77.0)
Platelets: 181 10*3/uL (ref 150.0–400.0)
RBC: 4.34 Mil/uL (ref 3.87–5.11)
RDW: 13.8 % (ref 11.5–15.5)
WBC: 3.6 10*3/uL — ABNORMAL LOW (ref 4.0–10.5)

## 2020-08-20 LAB — LIPID PANEL
Cholesterol: 193 mg/dL (ref 0–200)
HDL: 69.2 mg/dL (ref >39.00)
LDL Cholesterol: 109 mg/dL — ABNORMAL HIGH (ref 0–99)
NonHDL: 124.03
Total CHOL/HDL Ratio: 3
Triglycerides: 73 mg/dL (ref 0.0–149.0)
VLDL: 14.6 mg/dL (ref 0.0–40.0)

## 2020-08-21 LAB — URINALYSIS, ROUTINE W REFLEX MICROSCOPIC
Bilirubin Urine: NEGATIVE
Glucose, UA: NEGATIVE
Hgb urine dipstick: NEGATIVE
Ketones, ur: NEGATIVE
Leukocytes,Ua: NEGATIVE
Nitrite: NEGATIVE
Protein, ur: NEGATIVE
Specific Gravity, Urine: 1.003 (ref 1.001–1.035)
pH: 7 (ref 5.0–8.0)

## 2020-08-29 ENCOUNTER — Other Ambulatory Visit: Payer: Self-pay

## 2020-08-29 ENCOUNTER — Ambulatory Visit (INDEPENDENT_AMBULATORY_CARE_PROVIDER_SITE_OTHER): Payer: Managed Care, Other (non HMO) | Admitting: Internal Medicine

## 2020-08-29 ENCOUNTER — Encounter: Payer: Self-pay | Admitting: Internal Medicine

## 2020-08-29 VITALS — BP 118/84 | HR 59 | Temp 97.8°F | Ht 62.0 in | Wt 148.4 lb

## 2020-08-29 DIAGNOSIS — D72819 Decreased white blood cell count, unspecified: Secondary | ICD-10-CM | POA: Diagnosis not present

## 2020-08-29 DIAGNOSIS — Z9109 Other allergy status, other than to drugs and biological substances: Secondary | ICD-10-CM | POA: Diagnosis not present

## 2020-08-29 DIAGNOSIS — J9801 Acute bronchospasm: Secondary | ICD-10-CM | POA: Diagnosis not present

## 2020-08-29 MED ORDER — ALBUTEROL SULFATE HFA 108 (90 BASE) MCG/ACT IN AERS: 1.0000 | INHALATION_SPRAY | Freq: Four times a day (QID) | RESPIRATORY_TRACT | 0 refills | Status: DC | PRN

## 2020-08-29 NOTE — Progress Notes (Signed)
Chief Complaint  Patient presents with   Follow-up   Early for CPE will bill as f/u CPE due 12/2020  1. C/o rattling in chest no smoking history has allergies   Review of Systems  Constitutional:  Negative for weight loss.  HENT:  Negative for hearing loss.   Eyes:  Negative for blurred vision.  Respiratory:  Negative for shortness of breath.   Cardiovascular:  Negative for chest pain.  Gastrointestinal:  Negative for abdominal pain.  Musculoskeletal:  Negative for falls and joint pain.  Skin:  Negative for rash.  Neurological:  Negative for headaches.  Psychiatric/Behavioral:  Negative for depression.   Past Medical History:  Diagnosis Date   Allergies    Colon polyps    COVID-19    summer 2020, xmas 2021   Decreased sex drive    Depression    GERD (gastroesophageal reflux disease)    HLD (hyperlipidemia)    Hypercholesteremia    Osteopenia    Premature ovarian failure    UTI (urinary tract infection)    Past Surgical History:  Procedure Laterality Date   ABDOMINAL HYSTERECTOMY     lsh   CHOLECYSTECTOMY     TONSILLECTOMY AND ADENOIDECTOMY     Family History  Problem Relation Age of Onset   Osteoporosis Mother    Diabetes Mother    Depression Mother    Diabetes gravidarum Mother    Heart attack Mother    Hypertension Mother    Hyperlipidemia Mother    Mental illness Mother    Drug abuse Father    Hearing loss Father    Hyperlipidemia Father    Alcohol abuse Brother    Drug abuse Brother    Diverticulitis Brother    Heart disease Maternal Grandfather    Diverticulitis Maternal Aunt    Cancer Neg Hx    Ovarian cancer Neg Hx    Colon polyps Neg Hx    Breast cancer Neg Hx    Social History   Socioeconomic History   Marital status: Married    Spouse name: Not on file   Number of children: Not on file   Years of education: Not on file   Highest education level: Not on file  Occupational History   Not on file  Tobacco Use   Smoking status: Never    Smokeless tobacco: Never  Vaping Use   Vaping Use: Never used  Substance and Sexual Activity   Alcohol use: Yes    Comment: social   Drug use: No   Sexual activity: Yes    Birth control/protection: Surgical  Other Topics Concern   Not on file  Social History Narrative   Tennis, golf pickle ball    Married    Social Determinants of Health   Financial Resource Strain: Not on file  Food Insecurity: Not on file  Transportation Needs: Not on file  Physical Activity: Not on file  Stress: Not on file  Social Connections: Not on file  Intimate Partner Violence: Not on file   Current Meds  Medication Sig   calcium-vitamin D 250-100 MG-UNIT tablet Take 1 tablet by mouth 2 (two) times daily.   estradiol (VIVELLE-DOT) 0.1 MG/24HR patch Place 1 patch (0.1 mg total) onto the skin 2 (two) times a week.   albuterol (VENTOLIN HFA) 108 (90 Base) MCG/ACT inhaler Inhale 1-2 puffs into the lungs every 6 (six) hours as needed for wheezing or shortness of breath.   No Known Allergies Recent Results (from the past 2160  hour(s))  Urinalysis, Routine w reflex microscopic     Status: None   Collection Time: 08/20/20  7:58 AM  Result Value Ref Range   Color, Urine YELLOW YELLOW   APPearance CLEAR CLEAR   Specific Gravity, Urine 1.003 1.001 - 1.035   pH 7.0 5.0 - 8.0   Glucose, UA NEGATIVE NEGATIVE   Bilirubin Urine NEGATIVE NEGATIVE   Ketones, ur NEGATIVE NEGATIVE   Hgb urine dipstick NEGATIVE NEGATIVE   Protein, ur NEGATIVE NEGATIVE   Nitrite NEGATIVE NEGATIVE   Leukocytes,Ua NEGATIVE NEGATIVE  TSH     Status: None   Collection Time: 08/20/20  7:58 AM  Result Value Ref Range   TSH 3.60 0.35 - 4.50 uIU/mL  CBC with Differential/Platelet     Status: Abnormal   Collection Time: 08/20/20  7:58 AM  Result Value Ref Range   WBC 3.6 (L) 4.0 - 10.5 K/uL   RBC 4.34 3.87 - 5.11 Mil/uL   Hemoglobin 13.5 12.0 - 15.0 g/dL   HCT 39.7 36.0 - 46.0 %   MCV 91.6 78.0 - 100.0 fl   MCHC 33.9 30.0 - 36.0  g/dL   RDW 13.8 11.5 - 15.5 %   Platelets 181.0 150.0 - 400.0 K/uL   Neutrophils Relative % 51.3 43.0 - 77.0 %   Lymphocytes Relative 30.7 12.0 - 46.0 %   Monocytes Relative 9.5 3.0 - 12.0 %   Eosinophils Relative 7.4 (H) 0.0 - 5.0 %   Basophils Relative 1.1 0.0 - 3.0 %   Neutro Abs 1.9 1.4 - 7.7 K/uL   Lymphs Abs 1.1 0.7 - 4.0 K/uL   Monocytes Absolute 0.3 0.1 - 1.0 K/uL   Eosinophils Absolute 0.3 0.0 - 0.7 K/uL   Basophils Absolute 0.0 0.0 - 0.1 K/uL  Comprehensive metabolic panel     Status: None   Collection Time: 08/20/20  7:58 AM  Result Value Ref Range   Sodium 139 135 - 145 mEq/L   Potassium 4.2 3.5 - 5.1 mEq/L   Chloride 104 96 - 112 mEq/L   CO2 29 19 - 32 mEq/L   Glucose, Bld 89 70 - 99 mg/dL   BUN 15 6 - 23 mg/dL   Creatinine, Ser 0.71 0.40 - 1.20 mg/dL   Total Bilirubin 0.4 0.2 - 1.2 mg/dL   Alkaline Phosphatase 42 39 - 117 U/L   AST 13 0 - 37 U/L   ALT 9 0 - 35 U/L   Total Protein 6.6 6.0 - 8.3 g/dL   Albumin 4.2 3.5 - 5.2 g/dL   GFR 97.48 >60.00 mL/min    Comment: Calculated using the CKD-EPI Creatinine Equation (2021)   Calcium 9.3 8.4 - 10.5 mg/dL  Lipid panel     Status: Abnormal   Collection Time: 08/20/20  7:58 AM  Result Value Ref Range   Cholesterol 193 0 - 200 mg/dL    Comment: ATP III Classification       Desirable:  < 200 mg/dL               Borderline High:  200 - 239 mg/dL          High:  > = 240 mg/dL   Triglycerides 73.0 0.0 - 149.0 mg/dL    Comment: Normal:  <150 mg/dLBorderline High:  150 - 199 mg/dL   HDL 69.20 >39.00 mg/dL   VLDL 14.6 0.0 - 40.0 mg/dL   LDL Cholesterol 109 (H) 0 - 99 mg/dL   Total CHOL/HDL Ratio 3  Comment:                Men          Women1/2 Average Risk     3.4          3.3Average Risk          5.0          4.42X Average Risk          9.6          7.13X Average Risk          15.0          11.0                       NonHDL 124.03     Comment: NOTE:  Non-HDL goal should be 30 mg/dL higher than patient's LDL goal (i.e.  LDL goal of < 70 mg/dL, would have non-HDL goal of < 100 mg/dL)   Objective  Body mass index is 27.14 kg/m. Wt Readings from Last 3 Encounters:  08/29/20 148 lb 6.4 oz (67.3 kg)  05/08/20 148 lb 6 oz (67.3 kg)  12/09/19 148 lb 12.8 oz (67.5 kg)   Temp Readings from Last 3 Encounters:  08/29/20 97.8 F (36.6 C) (Oral)  05/08/20 97.6 F (36.4 C)  12/09/19 98.2 F (36.8 C) (Oral)   BP Readings from Last 3 Encounters:  08/29/20 118/84  05/08/20 138/86  12/09/19 124/82   Pulse Readings from Last 3 Encounters:  08/29/20 (!) 59  05/08/20 98  12/09/19 64    Physical Exam Vitals and nursing note reviewed.  Constitutional:      Appearance: Normal appearance. She is well-developed and well-groomed.  HENT:     Head: Normocephalic and atraumatic.  Eyes:     Conjunctiva/sclera: Conjunctivae normal.     Pupils: Pupils are equal, round, and reactive to light.  Cardiovascular:     Rate and Rhythm: Normal rate and regular rhythm.     Heart sounds: Normal heart sounds. No murmur heard. Pulmonary:     Effort: Pulmonary effort is normal.     Breath sounds: Normal breath sounds.  Abdominal:     General: Abdomen is flat. Bowel sounds are normal.  Skin:    General: Skin is warm and moist.  Neurological:     General: No focal deficit present.     Mental Status: She is alert and oriented to person, place, and time. Mental status is at baseline.     Gait: Gait normal.  Psychiatric:        Attention and Perception: Attention and perception normal.        Mood and Affect: Mood and affect normal.        Speech: Speech normal.        Behavior: Behavior normal. Behavior is cooperative.        Thought Content: Thought content normal.        Cognition and Memory: Cognition and memory normal.        Judgment: Judgment normal.    Assessment  Plan  Leukopenia, unspecified type - Plan: CBC with Differential/Platelet, B12, Folate  Bronchospasm could be due to allergies- Plan: albuterol  (VENTOLIN HFA) 108 (90 Base) MCG/ACT inhaler Environmental allergies  Nasal saline, flonase WITH  Claritin, allegra, xyzal, zyrtec as needed at night as needed    HM Reviewed labs 08/2020  Declines flu shot tdap given utd covid 2/2 consider booster  Disc shingrix in future  S/p hyst endometriosis pap 2018 negative pap neg hpv still have both ovaries Dr. WARd ob/gyn appt 12/2020 with new provider    mammo 10/20/19 negative ordered  dexa 10/19/18 neg Colonoscopy 11/19/17 requested records [facility] Dr. Shary Key  H/o polyp 50 due 23 colonoscopy consider [facility]  Had colonoscopy 11/19/17 Dr. Shary Key diverticulosis 8 mm polyp descending colon sessile serrated polyp/adenoma   Derm Dr. Kellie Moor  Summer 2021 negative f/u 1 year derm saw 06/2020 normal exam f/u in 1 year    [facility] -[facility]  [facility] [place] Dr. Arita Miss  rec healthy diet and exercise   rec D3 2000 Iu qd   H/o covid + summer 2020      Provider: Dr. Olivia Mackie McLean-[facility]oc[facility]

## 2020-08-29 NOTE — Patient Instructions (Addendum)
Nasal saline, flonase WITH  Claritin, allegra, xyzal, zyrtec as needed at night as needed  Rec shingrix and covid pfizer booster  Bronchospasm, Adult  Bronchospasm is a tightening of the smooth muscle that wraps around the small airways in the lungs. When the muscle tightens, the small airways narrow. Narrowed airways limit the air you breathe in or out of your lungs. Inflammation (swelling) and more mucus (sputum) than usual can further irritate the airways. This can make it very hard tobreathe. Bronchospasm can happen suddenly or over a period of time. What are the causes? Common causes of this condition include: An infection, such as a cold or sinus drainage. Exercise. Strong odors from aerosol sprays, and fumes from perfume, candles, and household cleaners. Cold air. Stress or strong emotions such as crying or laughing. What increases the risk? The following factors may make you more likely to develop this condition: Having asthma. Smoking or being around someone who smokes (secondhand smoke). Seasonal allergies, such as pollen or mold. Allergic reaction (anaphylaxis) to food, medicine, or insect bites or stings. What are the signs or symptoms? Symptoms of this condition include: Making a whistling sound when you breathe (wheezing). Coughing. Chest tightness. Shortness of breath. Decreased ability to exercise. Noisy breathing or a high-pitched cough. How is this diagnosed? This condition may be diagnosed based on your medical history and a physical exam. Your health care provider may also perform tests, including: A chest X-ray. Lung function tests. How is this treated? This condition may be treated by: Using inhaled medicines. These open up (relax) the airways and help you breathe. They can be taken with a metered dose inhaler or a nebulizer device. Taking corticosteroid medicines. These may be given to reduce inflammation and swelling. Removing the irritant or trigger that  started the bronchospasm. Follow these instructions at home: Medicines Take over-the-counter and prescription medicines only as told by your health care provider. If you need to use an inhaler or nebulizer to take your medicine, ask your health care provider how to use it correctly. You may be given a spacer to use with your inhaler. This makes it easier to get the medicine from the inhaler into your lungs. Lifestyle Do not use any products that contain nicotine or tobacco, such as cigarettes, e-cigarettes, and chewing tobacco. If you need help quitting, ask your health care provider. Keep track of things that trigger your bronchospasm. Avoid these if possible. When pollen, air pollution, or humidity levels are bad, keep windows closed and use an air conditioner or go to places that have air conditioning. Find ways to manage stress and your emotions, such as mindfulness, relaxation, or breathing exercises. Activity Some people have bronchospasm when they exercise. This is called exercise-induced bronchoconstriction (EIB). If you have this problem, talk with your health care provider about how to manage EIB. Some tips include: Use your fast-acting inhaler before exercise. Exercise indoors if it is very cold, humid, or if the pollen and mold counts are high. Warm up and cool down before and after exercise. Stop exercising right away if your symptoms start or get worse. General instructions If you have asthma, make sure you have an asthma action plan. Stay up to date on your immunizations. Keep all follow-up visits as told by your health care provider. This is important. Get help right away if: You have trouble breathing. Your wheezing and coughing do not get better after taking your medicine. You have chest pain. You have trouble speaking more than one-word sentences.  These symptoms may represent a serious problem that is an emergency. Do not wait to see if the symptoms will go away. Get  medical help right away. Call your local emergency services (911 in the [place] Do not drive yourself to the hospital. Summary Bronchospasm is a tightening of the smooth muscle that wraps around the small airways in the lungs. Some people have bronchospasm when they exercise. This is called exercise-induced bronchoconstriction (EIB). If you have this problem, talk with your health care provider about how to manage EIB. Do not use any products that contain nicotine or tobacco, such as cigarettes, e-cigarettes, and chewing tobacco. Get help right away if your wheezing and coughing do not get better after taking your medicine. This information is not intended to replace advice given to you by your health care provider. Make sure you discuss any questions you have with your healthcare provider. Document Revised: 03/29/2019 Document Reviewed: 03/29/2019 Elsevier Patient Education  [place], Adult An allergy is a condition in which the body's defense system (immune system) comes in contact with an allergen and reacts to it. An allergen is anything that causes an allergic reaction. Allergens cause the immune system to make proteins for fighting infections (antibodies). These antibodies cause cells to release chemicals called histamines that setoff the symptoms of an allergic reaction. Allergies often affect the nasal passages (allergic rhinitis), eyes (allergic conjunctivitis), skin (atopic dermatitis), and stomach. Allergies can be mild, moderate, or severe. They cannot spreadfrom person to person. Allergies can develop at any age and may be outgrown. What are the causes? This condition is caused by allergens. Common allergens include: Outdoor allergens, such as pollen, car fumes, and mold. Indoor allergens, such as dust, smoke, mold, and pet dander. Other allergens, such as foods, medicines, scents, insect bites or stings, and other skin irritants. What increases the risk? You are  more likely to develop this condition if you have: Family members with allergies. Family members who have any condition that may be caused by allergens, such as asthma. This may make you more likely to have other allergies. What are the signs or symptoms? Symptoms of this condition depend on the severity of the allergy. Mild to moderate symptoms Runny nose, stuffy nose (nasal congestion), or sneezing. Itchy mouth, ears, or throat. A feeling of mucus dripping down the back of your throat (postnasal drip). Sore throat. Itchy, red, watery, or puffy eyes. Skin rash, or itchy, red, swollen areas of skin (hives). Stomach cramps or bloating. Severe symptoms Severe allergies to food, medicine, or insect bites may cause anaphylaxis, which can be life-threatening. Symptoms include: A red (flushed) face. Wheezing or coughing. Swollen lips, tongue, or mouth. Tight or swollen throat. Chest pain or tightness, or rapid heartbeat. Trouble breathing or shortness of breath. Pain in the abdomen, vomiting, or diarrhea. Dizziness or fainting. How is this diagnosed? This condition is diagnosed based on your symptoms, your family and medical history, and a physical exam. You may also have tests, including: Skin tests to see how your skin reacts to allergens that may be causing your symptoms. Tests include: Skin prick test. For this test, an allergen is introduced to your body through a small opening in the skin. Intradermal skin test. For this test, a small amount of allergen is injected under the first layer of your skin. Patch test. For this test, a small amount of allergen is placed on your skin. The area is covered and then checked after a few days. Blood  tests. A challenge test. For this test, you will eat or breathe in a small amount of allergen to see if you have an allergic reaction. You may also be asked to: Keep a food diary. This is a record of all the foods, drinks, and symptoms you have in a  day. Try an elimination diet. To do this: Remove certain foods from your diet. Add those foods back one by one to find out if any foods cause an allergic reaction. How is this treated?     Treatment for allergies depends on your symptoms. Treatment may include: Cold, wet cloths (cold compresses) to soothe itching and swelling. Eye drops or nasal sprays. Nasal irrigation to help clear your mucus or keep the nasal passages moist. A humidifier to add moisture to the air. Skin creams to treat rashes or itching. Oral antihistamines or other medicines to block the reaction or to treat inflammation. Diet changes to remove foods that cause allergies. Being exposed again and again to tiny amounts of allergens to help you build a defense against it (tolerance). This is called immunotherapy. Examples include: Allergy shot. You receive an injection that contains an allergen. Sublingual immunotherapy. You take a small dose of allergen under your tongue. Emergency injection for anaphylaxis. You give yourself a shot using a syringe (auto-injector) that contains the amount of medicine you need. Your health care provider will teach you how to give yourself an injection. Follow these instructions at home: Medicines  Take or apply over-the-counter and prescription medicines only as told by your health care provider. Always carry your auto-injector pen if you are at risk of anaphylaxis. Give yourself an injection as told by your health care provider.  Eating and drinking Follow instructions from your health care provider about eating or drinking restrictions. Drink enough fluid to keep your urine pale yellow. General instructions Wear a medical alert bracelet or necklace to let others know that you have had anaphylaxis before. Avoid known allergens whenever possible. Keep all follow-up visits as told by your health care provider. This is important. Contact a health care provider if: Your symptoms do  not get better with treatment. Get help right away if: You have symptoms of anaphylaxis. These include: Swollen mouth, tongue, or throat. Pain or tightness in your chest. Trouble breathing or shortness of breath. Dizziness or fainting. Severe abdominal pain, vomiting, or diarrhea. These symptoms may represent a serious problem that is an emergency. Do not wait to see if the symptoms will go away. Get medical help right away. Call your local emergency services (911 in the [place] Do not drive yourself to the hospital. Summary Take or apply over-the-counter and prescription medicines only as told by your health care provider. Avoid known allergens when possible. Always carry your auto-injector pen if you are at risk of anaphylaxis. Give yourself an injection as told by your health care provider. Wear a medical alert bracelet or necklace to let others know that you have had anaphylaxis before. Anaphylaxis is a life-threatening emergency. Get help right away. This information is not intended to replace advice given to you by your health care provider. Make sure you discuss any questions you have with your healthcare provider. Document Revised: 10/17/2019 Document Reviewed: 12/29/2018 Elsevier Patient Education  2022 [place].  Leukopenia Leukopenia is a condition in which the body has a low number of white blood cells. White blood cells help the body to fight infections. There are five types of white blood cells. Two types of cells,  called lymphocytes andneutrophils, make up most of the white blood cell count. When lymphocytes are low, the condition is called lymphocytopenia. When neutrophils are low, the condition is called neutropenia. Neutropenia is the most dangerous type of leukopenia because it can lead to dangerous infections. Preventing infection is important if you have leukopenia. What are the causes? This condition is commonly caused by damage to soft tissue inside of the bones  (bone marrow), which is where most white blood cells are made. Bone marrow can get damaged by: Medicine or X-ray treatments for cancer (chemotherapy or radiation therapy). Serious infections. Cancer of the white blood cells. Medicines, including: Certain antibiotics. Certain heart medicines. Steroids. Certain medicines used to treat diseases of the immune system (autoimmune diseases), such as rheumatoid arthritis. Leukopenia also happens when white blood cells are destroyed after leaving the bone marrow, which may result from: Liver disease. Autoimmune disease. Vitamin B deficiencies. The number of white blood cells in the body varies from person to person. What are the signs or symptoms? In some cases, there are no symptoms. If you do have symptoms, one of the most common signs of leukopenia, especially severe neutropenia, is having a lot of bacterial infections. Different infections have different symptoms. An infection in your lungs may cause coughing. A urinary tract infection may cause frequent urination and a burning sensation. You may also get infections of theblood, skin, rectum, throat, sinuses, or ears. Other symptoms of this condition include: Fever, sweating, or chills. Fatigue. Swollen glands (lymph nodes). Painful mouth sores. Gum disease. How is this diagnosed? This condition may be diagnosed based on your medical history, a physical exam, and tests. During the physical exam, your health care provider will check for swollen lymph nodes and an enlarged spleen. Your spleen is an organ on the leftside of your body that stores white blood cells. You may also have tests, such as: A complete blood count. This blood test counts each type of white cell. Bone marrow aspiration. Some bone marrow is removed to be checked under a microscope. Lymph node biopsy. Some lymph node tissue is removed to be checked under a microscope. Other types of blood tests or imaging tests. How is this  treated? Treatment of leukopenia depends on the cause. Some common treatments include: Taking antibiotic medicine to treat bacterial infections. Stopping any medicines you are taking that may be causing leukopenia. Taking medicines to stimulate neutrophil production (hematopoietic growth factors), to treat neutropenia. Follow these instructions at home: Infection prevention Avoid close contact with sick people. Wash your hands often with soap and water. If soap and water are not available, use hand sanitizer. Do not eat uncooked or undercooked meats. Wash fruits and vegetables before eating them. Do not eat or drink unpasteurized dairy products. Get regular dental care, and maintain good dental hygiene. You should visit the dentist at least once every 6 months. Avoid fresh flowers or plants, which may have bacteria in the soil. General instructions Take over-the-counter and prescription medicines only as told by your health care provider. This includes supplements and vitamins. If you were prescribed an antibiotic medicine, take it as told by your health care provider. Do not stop taking the antibiotic even if you start to feel better. Avoid crowds. If you need to go to a public place where many people gather, try to visit at a time when it is likely to be less crowded. Keep all follow-up visits as told by your health care provider. This is important. Contact a health  care provider if you have: A temperature greater than 101F (38.3C), sweating, or chills. Swollen lymph nodes. Fatigue. Painful mouth sores, or gum disease. Cough, stuffy nose (nasal congestion), or sore throat. Pain or burning when you urinate, a strong need to urinate, or the need to urinate more often. Get help right away if you have: A temperature greater than 101F (38.3C) that lasts for more than 2 days. Symptoms such as cough, nasal congestion, sore throat, or urinary trouble that last for more than 2 days. Trouble  breathing. Chest pain. These symptoms may represent a serious problem that is an emergency. Do not wait to see if the symptoms will go away. Get medical help right away. Call your local emergency services (911 in the [place] Do not drive yourself to the hospital. Summary Leukopenia is a condition in which the body has a low number of white blood cells. White blood cells help the body to fight infections. Treatment of leukopenia depends on the cause. Preventing infection is important if you have leukopenia. Follow instructions for preventing infection. Get help right away if you have a temperature greater than 101F (38.3C) that lasts for more than 2 days. This information is not intended to replace advice given to you by your health care provider. Make sure you discuss any questions you have with your healthcare provider. Document Revised: 12/28/2018 Document Reviewed: 12/28/2018 Elsevier Patient Education  2022 [employer].

## 2020-08-31 ENCOUNTER — Encounter: Payer: Self-pay | Admitting: Gastroenterology

## 2020-09-17 ENCOUNTER — Encounter: Payer: Self-pay | Admitting: Internal Medicine

## 2020-09-17 NOTE — Progress Notes (Signed)
Faxed copy of colonoscopy report to [facility]. Confirmation was received and report sent to scan.

## 2020-09-19 ENCOUNTER — Other Ambulatory Visit: Payer: Self-pay

## 2020-09-19 ENCOUNTER — Other Ambulatory Visit (INDEPENDENT_AMBULATORY_CARE_PROVIDER_SITE_OTHER): Payer: Managed Care, Other (non HMO)

## 2020-09-19 DIAGNOSIS — D72819 Decreased white blood cell count, unspecified: Secondary | ICD-10-CM

## 2020-09-19 LAB — CBC WITH DIFFERENTIAL/PLATELET
Basophils Absolute: 0.1 10*3/uL (ref 0.0–0.1)
Basophils Relative: 1.2 % (ref 0.0–3.0)
Eosinophils Absolute: 0.3 10*3/uL (ref 0.0–0.7)
Eosinophils Relative: 5.3 % — ABNORMAL HIGH (ref 0.0–5.0)
HCT: 41 % (ref 36.0–46.0)
Hemoglobin: 13.8 g/dL (ref 12.0–15.0)
Lymphocytes Relative: 23.8 % (ref 12.0–46.0)
Lymphs Abs: 1.4 10*3/uL (ref 0.7–4.0)
MCHC: 33.7 g/dL (ref 30.0–36.0)
MCV: 91.7 fl (ref 78.0–100.0)
Monocytes Absolute: 0.3 10*3/uL (ref 0.1–1.0)
Monocytes Relative: 5.6 % (ref 3.0–12.0)
Neutro Abs: 3.8 10*3/uL (ref 1.4–7.7)
Neutrophils Relative %: 64.1 % (ref 43.0–77.0)
Platelets: 217 10*3/uL (ref 150.0–400.0)
RBC: 4.47 Mil/uL (ref 3.87–5.11)
RDW: 14.1 % (ref 11.5–15.5)
WBC: 6 10*3/uL (ref 4.0–10.5)

## 2020-09-19 LAB — FOLATE: Folate: >24.4 ng/mL (ref >5.9)

## 2020-09-19 LAB — VITAMIN B12: Vitamin B-12: >1550 pg/mL — ABNORMAL HIGH (ref 211–911)

## 2020-10-24 ENCOUNTER — Ambulatory Visit: Payer: Managed Care, Other (non HMO) | Admitting: Gastroenterology

## 2020-10-24 ENCOUNTER — Encounter: Payer: Self-pay | Admitting: Gastroenterology

## 2020-10-24 VITALS — BP 120/70 | HR 64 | Ht 62.0 in | Wt 153.0 lb

## 2020-10-24 DIAGNOSIS — R194 Change in bowel habit: Secondary | ICD-10-CM

## 2020-10-24 DIAGNOSIS — R933 Abnormal findings on diagnostic imaging of other parts of digestive tract: Secondary | ICD-10-CM | POA: Diagnosis not present

## 2020-10-24 DIAGNOSIS — R1032 Left lower quadrant pain: Secondary | ICD-10-CM | POA: Diagnosis not present

## 2020-10-24 MED ORDER — SUTAB 1479-225-188 MG PO TABS: 1.0000 | ORAL_TABLET | Freq: Once | ORAL | 0 refills | Status: AC

## 2020-10-24 MED ORDER — DICYCLOMINE HCL 10 MG PO CAPS: 10.0000 mg | ORAL_CAPSULE | Freq: Three times a day (TID) | ORAL | 1 refills | Status: DC | PRN

## 2020-10-24 MED ORDER — CITRUCEL PO POWD: ORAL | Status: DC

## 2020-10-24 NOTE — Patient Instructions (Addendum)
If you are age 53 or older, your body mass index should be between 23-30. Your Body mass index is 27.98 kg/m. If this is out of the aforementioned range listed, please consider follow up with your Primary Care Provider.  If you are age 66 or younger, your body mass index should be between 19-25. Your Body mass index is 27.98 kg/m. If this is out of the aformentioned range listed, please consider follow up with your Primary Care Provider.   __________________________________________________________  The [facility] providers would like to encourage you to use [facility] to communicate with providers for non-urgent requests or questions.  Due to long hold times on the telephone, sending your provider a message by [facility] may be a faster and more efficient way to get a response.  Please allow 48 business hours for a response.  Please remember that this is for non-urgent requests.   Continue Citrucel.  Minimize NSAIDs.  We have sent the following medications to your pharmacy for you to pick up at your convenience: Bentyl 10 mg: Take one to two tablets every 8 hours as needed  Thank you for entrusting me with your care and for choosing [employer], Dr. [place] Cellar

## 2020-10-24 NOTE — Progress Notes (Signed)
HPI :  55 year old female with history of GERD, diverticulosis, history of colon polyps, referred by Olivia Mackie McLean-Scocuzza MD for left lower quadrant pain and abnormal CT scan.  She states for a few years she has been having intermittent episodes of left lower quadrant pain.  When she would get a "episode", she will have discomfort in her left lower quadrant associated with significant bloating and altered bowel habits, can have both diarrhea and constipation with this.  Episodes can last about a week or so until they resolved.  This had been ongoing perhaps 1 episode per year but now is having more frequent episodes perhaps once every other month or so.  She is very uncomfortable when this happens, has a hard time finding any clear triggers.  The pain can radiate to her back.  In between episodes her bowels are pretty regular.  She does take Citrucel once daily at baseline.  She denies any blood in her stools.  She cannot find any clear dietary triggers or changes to her diet recently.  She is not on any medications.  She has no family history of colon cancer or family history of IBD.  She does take some ibuprofen fairly routinely for joint pains while she is playing tennis, she tries not to take it every day.  Her last colonoscopy was performed about 3 years ago in [place], she had an 8 mm polyp removed and sigmoid diverticulosis.  During one of her episodes of pain she had a CT scan performed this past March which showed thickening of the sigmoid colon, no overt diverticulitis.  She is concerned about that causing these episodes how to treat them moving forward.    [place] complete 08/30/19: IMPRESSION: Gallbladder absent.  Study otherwise unremarkable.  CT scan abdomen / pelvis 05/09/20 - IMPRESSION: 1. Apparent short segment sigmoid colonic wall thickening may be secondary to underdistention. However, given the patient's age and the presence of diverticula in this region, correlation with  the patient's colon cancer screening history is recommended. If screening is not up-to-date, appropriate screening should be considered. 2. Sigmoid colonic diverticulosis without findings of acute diverticulitis. 3. Uterine leiomyomas. 4. Aortic atherosclerosis.   Colonoscopy 11/19/17 - Dr. Lucienne Capers - good prep - sigmoid diverticulosis, 59m descending polyp removed hot snare - "sessile serrated polyp / adenoma".    Past Medical History:  Diagnosis Date   Allergies    COVID-19    summer 2020, xmas 2021   Decreased sex drive    Depression    Diverticulosis 2019   GERD (gastroesophageal reflux disease)    HLD (hyperlipidemia)    Hx of adenomatous colonic polyps 11/2017   8 mm sessile serrated/adenoma in descending colon   Hypercholesteremia    Osteopenia    Premature ovarian failure    UTI (urinary tract infection)      Past Surgical History:  Procedure Laterality Date   ABDOMINAL HYSTERECTOMY     lsh   CHOLECYSTECTOMY     TONSILLECTOMY AND ADENOIDECTOMY     Family History  Problem Relation Age of Onset   Osteoporosis Mother    Diabetes Mother    Depression Mother    Diabetes gravidarum Mother    Heart attack Mother    Hypertension Mother    Hyperlipidemia Mother    Mental illness Mother    Drug abuse Father    Hearing loss Father    Hyperlipidemia Father    Alcohol abuse Brother    Drug abuse Brother  Diverticulitis Brother    Heart disease Maternal Grandfather    Diverticulitis Maternal Aunt    Cancer Neg Hx    Ovarian cancer Neg Hx    Colon polyps Neg Hx    Breast cancer Neg Hx    Social History   Tobacco Use   Smoking status: Never   Smokeless tobacco: Never  Vaping Use   Vaping Use: Never used  Substance Use Topics   Alcohol use: Yes    Comment: social   Drug use: No   Current Outpatient Medications  Medication Sig Dispense Refill   albuterol (VENTOLIN HFA) 108 (90 Base) MCG/ACT inhaler Inhale 1-2 puffs into the lungs every 6  (six) hours as needed for wheezing or shortness of breath. 18 g 0   calcium-vitamin D 250-100 MG-UNIT tablet Take 1 tablet by mouth 2 (two) times daily.     estradiol (VIVELLE-DOT) 0.1 MG/24HR patch Place 1 patch (0.1 mg total) onto the skin 2 (two) times a week. 24 patch 3   No current facility-administered medications for this visit.   No Known Allergies   Review of Systems: All systems reviewed and negative except where noted in HPI.   Lab Results  Component Value Date   WBC 6.0 09/19/2020   HGB 13.8 09/19/2020   HCT 41.0 09/19/2020   MCV 91.7 09/19/2020   PLT 217.0 09/19/2020    Lab Results  Component Value Date   CREATININE 0.71 08/20/2020   BUN 15 08/20/2020   NA 139 08/20/2020   K 4.2 08/20/2020   CL 104 08/20/2020   CO2 29 08/20/2020    Lab Results  Component Value Date   ALT 9 08/20/2020   AST 13 08/20/2020   ALKPHOS 42 08/20/2020   BILITOT 0.4 08/20/2020      Physical Exam: BP 120/70   Pulse 64   Ht '5\' 2"'$  (1.575 m)   Wt 153 lb (69.4 kg)   BMI 27.98 kg/m  Constitutional: Pleasant,well-developed, female in no acute distress. HEENT: Normocephalic and atraumatic. Conjunctivae are normal. No scleral icterus. Neck supple.  Cardiovascular: Normal rate, regular rhythm.  Pulmonary/chest: Effort normal and breath sounds normal.  Abdominal: Soft, nondistended, nontender. There are no masses palpable.  Extremities: no edema Lymphadenopathy: No cervical adenopathy noted. Neurological: Alert and oriented to person place and time. Skin: Skin is warm and dry. No rashes noted. Psychiatric: Normal mood and affect. Behavior is normal.   ASSESSMENT AND PLAN: 55 year old female here for new patient assessment of following:  Left lower quadrant abdominal pain Altered bowel habits Abnormal CT scan sigmoid colon  History as above.  Patient with intermittent episodes of severe pain associated with bloating and altered bowel habits, these episodes are becoming more  frequently lately.  CT scan of her abdomen showed some thickening of her sigmoid colon without diverticulitis in March.  Discussed differential diagnosis with her.  Symptomatic diverticular disease (SUDD), segmental colitis from diverticulosis both possible, recurrent diverticulitis not seen on the CT scan however possible.  Crohn's would seem less likely, as would polyp/mass lesion.  Following discussion of this and her progressive symptoms, I think a colonoscopy is reasonable to assess severity of diverticulosis, assess for significant stricturing or segmental colitis associated with this.  I discussed colonoscopy and risk benefits with her versus flex sig.  Given her history of polyps and that she would need a colonoscopy in a few years anyway, she would for to do full colonoscopy for this issue.  She will be referred to the  scheduler for this.  In the interim I asked her to reduce her NSAID use in case this is related in any way.  She will continue Citrucel daily, and I offered her some Bentyl to use as needed for pain and discomfort if she gets a recurrent episode.  She agreed  Plan: - colonoscopy - continue Citrucel once daily - minimize NSAID use - trial of Bentyl '10mg'$  every 8 hours PRN pain during an episode  Jolly Mango, MD [facility]  CC: McLean-Scocuzza, Olivia Mackie *

## 2020-10-25 ENCOUNTER — Ambulatory Visit
Admission: RE | Admit: 2020-10-25 | Discharge: 2020-10-25 | Disposition: A | Discharge status: Home / Self Care | Payer: Managed Care, Other (non HMO) | Source: Ambulatory Visit | Attending: Internal Medicine | Admitting: Internal Medicine

## 2020-10-25 ENCOUNTER — Other Ambulatory Visit: Payer: Self-pay

## 2020-10-25 DIAGNOSIS — Z1231 Encounter for screening mammogram for malignant neoplasm of breast: Secondary | ICD-10-CM | POA: Insufficient documentation

## 2020-11-13 ENCOUNTER — Other Ambulatory Visit: Payer: Self-pay

## 2020-11-13 ENCOUNTER — Encounter: Payer: Self-pay | Admitting: Gastroenterology

## 2020-11-13 ENCOUNTER — Ambulatory Visit (AMBULATORY_SURGERY_CENTER): Payer: Managed Care, Other (non HMO) | Admitting: Gastroenterology

## 2020-11-13 VITALS — BP 100/56 | HR 69 | Temp 97.8°F | Resp 20 | Ht 62.0 in | Wt 153.0 lb

## 2020-11-13 DIAGNOSIS — K5730 Diverticulosis of large intestine without perforation or abscess without bleeding: Secondary | ICD-10-CM

## 2020-11-13 DIAGNOSIS — R933 Abnormal findings on diagnostic imaging of other parts of digestive tract: Secondary | ICD-10-CM | POA: Diagnosis not present

## 2020-11-13 DIAGNOSIS — K648 Other hemorrhoids: Secondary | ICD-10-CM

## 2020-11-13 DIAGNOSIS — R1032 Left lower quadrant pain: Secondary | ICD-10-CM | POA: Diagnosis not present

## 2020-11-13 MED ORDER — SODIUM CHLORIDE 0.9 % IV SOLN: 500.0000 mL | INTRAVENOUS | Status: DC

## 2020-11-13 NOTE — Progress Notes (Signed)
Sedate, gd SR, tolerated procedure well, VSS, report to RN 

## 2020-11-13 NOTE — Progress Notes (Signed)
History and Physical Interval Note: Patient seen on 10/24/20 - no interval changes since I saw her. No further episodes of pain. Having colonoscopy given intermittent LLQ pain, thickening of sigmoid colon on CT Scan, history of polyps. No changes in the exam. Feeling well otherwise  11/13/2020 2:44 PM  Pricilla Handler  has presented today for endoscopic procedure(s), with the diagnosis of  Encounter Diagnoses  Name Primary?   LLQ pain Yes   Abnormal CT scan, gastrointestinal tract   .  The various methods of evaluation and treatment have been discussed with the patient and/or family. After consideration of risks, benefits and other options for treatment, the patient has consented to  the endoscopic procedure(s).   The patient's history has been reviewed, patient examined, no change in status, stable for surgery.  I have reviewed the patient's chart and labs.  Questions were answered to the patient's satisfaction.    Jolly Mango, MD [facility]

## 2020-11-13 NOTE — Op Note (Signed)
[facility] Patient Name: Deanna Young Procedure Date: 11/13/2020 2:38 PM MRN: [id number] Endoscopist: Remo Lipps P. Havery Moros , MD Age: 55 Referring MD:  Date of Birth: 11/13/1965 Gender: Female Account #: [id number] Procedure:                Colonoscopy Indications:              Abnormal CT of the GI tract (thickening of the                            sigmoid colon) - no diverticulitis, intermittent                            episodes of abdominal pain in the left lower                            quadrant Medicines:                Monitored Anesthesia Care Procedure:                Pre-Anesthesia Assessment:                           - Prior to the procedure, a History and Physical                            was performed, and patient medications and                            allergies were reviewed. The patient's tolerance of                            previous anesthesia was also reviewed. The risks                            and benefits of the procedure and the sedation                            options and risks were discussed with the patient.                            All questions were answered, and informed consent                            was obtained. Prior Anticoagulants: The patient has                            taken no previous anticoagulant or antiplatelet                            agents. ASA Grade Assessment: II - A patient with                            mild systemic disease. After reviewing the risks  and benefits, the patient was deemed in                            satisfactory condition to undergo the procedure.                           After obtaining informed consent, the colonoscope                            was passed under direct vision. Throughout the                            procedure, the patient's blood pressure, pulse, and                            oxygen saturations were monitored continuously. The                             Olympus [id number]-H190DL [id number]) Colonoscope was                            introduced through the anus and advanced to the the                            terminal ileum, with identification of the                            appendiceal orifice and IC valve. The colonoscopy                            was performed without difficulty. The patient                            tolerated the procedure well. The quality of the                            bowel preparation was adequate. The terminal ileum,                            ileocecal valve, appendiceal orifice, and rectum                            were photographed. Scope In: 2:49:07 PM Scope Out: 3:13:43 PM Scope Withdrawal Time: 0 hours 17 minutes 18 seconds  Total Procedure Duration: 0 hours 24 minutes 36 seconds  Findings:                 The perianal and digital rectal examinations were                            normal.                           The terminal ileum appeared normal.  Multiple small-mouthed diverticula were found in                            the left colon. There was associated luminal                            narrowing with this in the distal sigmoid colon and                            a markedly angulated turn in the rectosigmoid colon                            which was carefully navigated.                           Internal hemorrhoids were found during                            retroflexion. The hemorrhoids were small.                           The exam was otherwise without abnormality. Complications:            No immediate complications. Estimated blood loss:                            None. Estimated Blood Loss:     Estimated blood loss: none. Impression:               - The examined portion of the ileum was normal.                           - Diverticulosis in the left colon.                           - Internal hemorrhoids.                           - The  examination was otherwise normal.                           - No polyps.                           Suspect patient could be having intermittent                            symptomatic diverticular disease in association                            with luminal narrowing. Doing better recently. Recommendation:           - Patient has a contact number available for                            emergencies. The signs and symptoms of potential  delayed complications were discussed with the                            patient. Return to normal activities tomorrow.                            Written discharge instructions were provided to the                            patient.                           - Resume previous diet.                           - Continue present medications.                           - Continue Citrucel once daily                           - Bentyl as needed for recurrent symptoms                           - Repeat colonoscopy in 5 years for surveillance                            given history of SSP removed on exam 3 years ago [place]. Havery Moros, MD 11/13/2020 3:22:44 PM This report has been signed electronically.

## 2020-11-13 NOTE — Patient Instructions (Signed)
Handout on diverticulosis given.   YOU HAD AN ENDOSCOPIC PROCEDURE TODAY AT THE [facility]:   Refer to the procedure report that was given to you for any specific questions about what was found during the examination.  If the procedure report does not answer your questions, please call your gastroenterologist to clarify.  If you requested that your care partner not be given the details of your procedure findings, then the procedure report has been included in a sealed envelope for you to review at your convenience later.  YOU SHOULD EXPECT: Some feelings of bloating in the abdomen. Passage of more gas than usual.  Walking can help get rid of the air that was put into your GI tract during the procedure and reduce the bloating. If you had a lower endoscopy (such as a colonoscopy or flexible sigmoidoscopy) you may notice spotting of blood in your stool or on the toilet paper. If you underwent a bowel prep for your procedure, you may not have a normal bowel movement for a few days.  Please Note:  You might notice some irritation and congestion in your nose or some drainage.  This is from the oxygen used during your procedure.  There is no need for concern and it should clear up in a day or so.  SYMPTOMS TO REPORT IMMEDIATELY:   Following lower endoscopy (colonoscopy or flexible sigmoidoscopy):  Excessive amounts of blood in the stool  Significant tenderness or worsening of abdominal pains  Swelling of the abdomen that is new, acute  Fever of 100F or higher   For urgent or emergent issues, a gastroenterologist can be reached at any hour by calling [phone]. Do not use MyChart messaging for urgent concerns.    DIET:  We do recommend a small meal at first, but then you may proceed to your regular diet.  Drink plenty of fluids but you should avoid alcoholic beverages for 24 hours.  ACTIVITY:  You should plan to take it easy for the rest of today and you should NOT DRIVE or use  heavy machinery until tomorrow (because of the sedation medicines used during the test).    FOLLOW UP: Our staff will call the number listed on your records 48-72 hours following your procedure to check on you and address any questions or concerns that you may have regarding the information given to you following your procedure. If we do not reach you, we will leave a message.  We will attempt to reach you two times.  During this call, we will ask if you have developed any symptoms of COVID 19. If you develop any symptoms (ie: fever, flu-like symptoms, shortness of breath, cough etc.) before then, please call [phone].  If you test positive for Covid 19 in the 2 weeks post procedure, please call and report this information to us.    If any biopsies were taken you will be contacted by phone or by letter within the next 1-3 weeks.  Please call us at [phone] if you have not heard about the biopsies in 3 weeks.    SIGNATURES/CONFIDENTIALITY: You and/or your care partner have signed paperwork which will be entered into your electronic medical record.  These signatures attest to the fact that that the information above on your After Visit Summary has been reviewed and is understood.  Full responsibility of the confidentiality of this discharge information lies with you and/or your care-partner. 

## 2020-11-15 ENCOUNTER — Telehealth: Payer: Self-pay

## 2020-11-15 NOTE — Telephone Encounter (Signed)
  Follow up Call-  Call back number 11/13/2020  Post procedure Call Back phone  # [phone]  Some recent data might be hidden     Patient questions:  Do you have a fever, pain , or abdominal swelling? No. Pain Score  0 *  Have you tolerated food without any problems? Yes.    Have you been able to return to your normal activities? Yes.    Do you have any questions about your discharge instructions: Diet   No. Medications  No. Follow up visit  No.  Do you have questions or concerns about your Care? No.  Actions: * If pain score is 4 or above: No action needed, pain <4.  Have you developed a fever since your procedure? no  2.   Have you had an respiratory symptoms (SOB or cough) since your procedure? no  3.   Have you tested positive for COVID 19 since your procedure no  4.   Have you had any family members/close contacts diagnosed with the COVID 19 since your procedure?  no   If yes to any of these questions please route to Joylene John, RN and Joella Prince, RN

## 2021-02-01 NOTE — Progress Notes (Signed)
Young, Deanna Raspberry, MD  Young, Deanna Glow, MD Sure.  I saw the patient in the office for this issue in August and we discussed her CT scan which led to colonoscopy in September. She has severe diverticulosis with luminal narrowing in the sigmoid colon causing this finding, no further workup is needed for the colonic imaging finding. Thanks

## 2021-02-04 ENCOUNTER — Telehealth: Payer: Self-pay

## 2021-02-04 NOTE — Telephone Encounter (Signed)
Patient called back . Given information that provider outlined. Patient understood stated information was old.

## 2021-02-04 NOTE — Telephone Encounter (Signed)
-----   Message from Delorise Jackson, MD sent at 02/01/2021  4:23 PM EST ----- Small hiatal hernia Diverticulosis  Plaque build up in aorta  ?uterine fibroids  Arthritis spine +  Dr. Havery Moros can you review CT scan based on colonoscopy findings ?

## 2021-08-30 ENCOUNTER — Encounter: Payer: Managed Care, Other (non HMO) | Admitting: Internal Medicine

## 2021-09-11 DIAGNOSIS — D2272 Melanocytic nevi of left lower limb, including hip: Secondary | ICD-10-CM | POA: Diagnosis not present

## 2021-09-11 DIAGNOSIS — D2262 Melanocytic nevi of left upper limb, including shoulder: Secondary | ICD-10-CM | POA: Diagnosis not present

## 2021-09-11 DIAGNOSIS — D2261 Melanocytic nevi of right upper limb, including shoulder: Secondary | ICD-10-CM | POA: Diagnosis not present

## 2021-09-11 DIAGNOSIS — D225 Melanocytic nevi of trunk: Secondary | ICD-10-CM | POA: Diagnosis not present

## 2021-09-13 ENCOUNTER — Encounter: Payer: Managed Care, Other (non HMO) | Admitting: Internal Medicine

## 2021-09-17 ENCOUNTER — Encounter: Payer: Managed Care, Other (non HMO) | Admitting: Internal Medicine

## 2021-09-19 ENCOUNTER — Encounter: Payer: Self-pay | Admitting: Internal Medicine

## 2021-09-19 ENCOUNTER — Ambulatory Visit (INDEPENDENT_AMBULATORY_CARE_PROVIDER_SITE_OTHER): Payer: BC Managed Care – PPO | Admitting: Internal Medicine

## 2021-09-19 VITALS — BP 120/72 | HR 69 | Temp 98.4°F | Ht 62.01 in | Wt 144.6 lb

## 2021-09-19 DIAGNOSIS — Z0000 Encounter for general adult medical examination without abnormal findings: Secondary | ICD-10-CM | POA: Diagnosis not present

## 2021-09-19 DIAGNOSIS — Z1231 Encounter for screening mammogram for malignant neoplasm of breast: Secondary | ICD-10-CM

## 2021-09-19 DIAGNOSIS — Z1389 Encounter for screening for other disorder: Secondary | ICD-10-CM

## 2021-09-19 DIAGNOSIS — Z1329 Encounter for screening for other suspected endocrine disorder: Secondary | ICD-10-CM

## 2021-09-19 DIAGNOSIS — Z13220 Encounter for screening for lipoid disorders: Secondary | ICD-10-CM | POA: Diagnosis not present

## 2021-09-19 NOTE — Patient Instructions (Addendum)
[place] [facility]  Call to scheduled mammo due  10/25/21  Address: [address], [place], [place] [address] Hours:  Open ? Closes 5?PM Phone: [phone]  Colonoscopy due 11/13/25   Consider shingles vaccine x 2 doses

## 2021-09-19 NOTE — Progress Notes (Signed)
Chief Complaint  Patient presents with   Annual Exam    PE with no other complaints   Annual  1. No concerns  Referred mammo ob/gyn upcoming at [facility]     Review of Systems  Constitutional:  Negative for weight loss.  HENT:  Negative for hearing loss.   Eyes:  Negative for blurred vision.  Respiratory:  Negative for shortness of breath.   Cardiovascular:  Negative for chest pain.  Gastrointestinal:  Negative for abdominal pain and blood in stool.  Genitourinary:  Negative for dysuria.  Musculoskeletal:  Negative for falls and joint pain.  Skin:  Negative for rash.  Neurological:  Negative for headaches.  Psychiatric/Behavioral:  Negative for depression.    Past Medical History:  Diagnosis Date   Allergies    COVID-19    summer 2020, xmas 2021   Decreased sex drive    Depression    Diverticulosis 2019   GERD (gastroesophageal reflux disease)    HLD (hyperlipidemia)    Hx of adenomatous colonic polyps 11/2017   8 mm sessile serrated/adenoma in descending colon   Hypercholesteremia    Osteopenia    Premature ovarian failure    UTI (urinary tract infection)    Past Surgical History:  Procedure Laterality Date   ABDOMINAL HYSTERECTOMY     lsh   CHOLECYSTECTOMY     TONSILLECTOMY AND ADENOIDECTOMY     Family History  Problem Relation Age of Onset   Osteoporosis Mother    Diabetes Mother    Depression Mother    Diabetes gravidarum Mother    Heart attack Mother    Hypertension Mother    Hyperlipidemia Mother    Mental illness Mother    Drug abuse Father    Hearing loss Father    Hyperlipidemia Father    Alcohol abuse Brother    Drug abuse Brother    Diverticulitis Brother    Diverticulitis Maternal Aunt    Heart disease Maternal Grandfather    Cancer Neg Hx    Ovarian cancer Neg Hx    Colon polyps Neg Hx    Breast cancer Neg Hx    Colon cancer Neg Hx    Stomach cancer Neg Hx    Rectal cancer Neg Hx    Esophageal cancer Neg Hx    Social History    Socioeconomic History   Marital status: Married    Spouse name: Not on file   Number of children: 2   Years of education: Not on file   Highest education level: Not on file  Occupational History   Not on file  Tobacco Use   Smoking status: Never   Smokeless tobacco: Never  Vaping Use   Vaping Use: Never used  Substance and Sexual Activity   Alcohol use: Yes    Comment: social   Drug use: No   Sexual activity: Yes    Birth control/protection: Surgical  Other Topics Concern   Not on file  Social History Narrative   Tennis, golf pickle ball    Married    Social Determinants of Health   Financial Resource Strain: Not on file  Food Insecurity: Not on file  Transportation Needs: Not on file  Physical Activity: Sufficiently Active (08/19/2017)   Exercise Vital Sign    Days of Exercise per Week: 5 days    Minutes of Exercise per Session: 60 min  Stress: Not on file  Social Connections: Not on file  Intimate Partner Violence: Not on file  Current Meds  Medication Sig   albuterol (VENTOLIN HFA) 108 (90 Base) MCG/ACT inhaler Inhale 1-2 puffs into the lungs every 6 (six) hours as needed for wheezing or shortness of breath.   calcium-vitamin D 250-100 MG-UNIT tablet Take 1 tablet by mouth 2 (two) times daily.   dicyclomine (BENTYL) 10 MG capsule Take 1-2 capsules (10-20 mg total) by mouth every 8 (eight) hours as needed for spasms.   estradiol (VIVELLE-DOT) 0.1 MG/24HR patch Place 1 patch (0.1 mg total) onto the skin 2 (two) times a week.   No Known Allergies No results found for this or any previous visit (from the past 2160 hour(s)). Objective  Body mass index is 26.44 kg/m. Wt Readings from Last 3 Encounters:  09/19/21 144 lb 9.6 oz (65.6 kg)  11/13/20 153 lb (69.4 kg)  10/24/20 153 lb (69.4 kg)   Temp Readings from Last 3 Encounters:  09/19/21 98.4 F (36.9 C) (Oral)  11/13/20 97.8 F (36.6 C)  08/29/20 97.8 F (36.6 C) (Oral)   BP Readings from Last 3  Encounters:  09/19/21 120/72  11/13/20 (!) 100/56  10/24/20 120/70   Pulse Readings from Last 3 Encounters:  09/19/21 69  11/13/20 69  10/24/20 64    Physical Exam Vitals and nursing note reviewed.  Constitutional:      Appearance: Normal appearance. She is well-developed and well-groomed.  HENT:     Head: Normocephalic and atraumatic.  Eyes:     Conjunctiva/sclera: Conjunctivae normal.     Pupils: Pupils are equal, round, and reactive to light.  Cardiovascular:     Rate and Rhythm: Normal rate and regular rhythm.     Heart sounds: Normal heart sounds. No murmur heard. Pulmonary:     Effort: Pulmonary effort is normal.     Breath sounds: Normal breath sounds.  Abdominal:     General: Abdomen is flat. Bowel sounds are normal.     Tenderness: There is no abdominal tenderness.  Musculoskeletal:        General: No tenderness.  Skin:    General: Skin is warm and dry.  Neurological:     General: No focal deficit present.     Mental Status: She is alert and oriented to person, place, and time. Mental status is at baseline.     Cranial Nerves: Cranial nerves 2-12 are intact.     Motor: Motor function is intact.     Coordination: Coordination is intact.     Gait: Gait is intact.  Psychiatric:        Attention and Perception: Attention and perception normal.        Mood and Affect: Mood and affect normal.        Speech: Speech normal.        Behavior: Behavior normal. Behavior is cooperative.        Thought Content: Thought content normal.        Cognition and Memory: Cognition and memory normal.        Judgment: Judgment normal.     Assessment  Plan  Annual physical exam - Plan: Comprehensive metabolic panel, Lipid panel, CBC with Differential/Platelet, TSH, Urinalysis, Routine w reflex microscopic See below   Screening mammogram, encounter for - Plan: MM 3D SCREEN BREAST BILATERAL   Declines flu shot tdap given utd covid 2/2 consider booster  Disc shingrix in  future will think about Declines hep C/HIV   S/p hyst endometriosis pap 2018 negative pap neg hpv still have both ovaries Dr. Leonides Schanz ob/gyn  appt 12/2020 with new provider  [facility] August 2023   Mammo 10/25/20 negative ordered call to schedule   dexa 10/19/18 neg  Colonoscopy 11/19/17 requested records ROI Dr. Shary Key  H/o polyp 74  Had colonoscopy 11/19/17 Dr. Shary Key diverticulosis 8 mm polyp descending colon sessile serrated polyp/adenoma Utd colonoscopy 11/13/20 next  Colonoscopy 11/13/25 due leb GI  Derm Dr. Kellie Moor  Summer 2023 negative f/u 1 year derm    eye -yearly  Dental [place] Dr. Arita Miss  rec healthy diet and exercise    rec D3 2000 Iu qd   H/o covid + summer 2020  Provider: Dr. Olivia Mackie McLean-Scocuzza-[facility]

## 2021-09-20 ENCOUNTER — Other Ambulatory Visit (INDEPENDENT_AMBULATORY_CARE_PROVIDER_SITE_OTHER): Payer: BC Managed Care – PPO

## 2021-09-20 DIAGNOSIS — Z13220 Encounter for screening for lipoid disorders: Secondary | ICD-10-CM | POA: Diagnosis not present

## 2021-09-20 DIAGNOSIS — Z0000 Encounter for general adult medical examination without abnormal findings: Secondary | ICD-10-CM | POA: Diagnosis not present

## 2021-09-20 DIAGNOSIS — Z1329 Encounter for screening for other suspected endocrine disorder: Secondary | ICD-10-CM | POA: Diagnosis not present

## 2021-09-20 DIAGNOSIS — Z1389 Encounter for screening for other disorder: Secondary | ICD-10-CM

## 2021-09-20 LAB — URINALYSIS, ROUTINE W REFLEX MICROSCOPIC
Bilirubin Urine: NEGATIVE
Ketones, ur: NEGATIVE
Nitrite: NEGATIVE
Specific Gravity, Urine: <=1.005 — AB (ref 1.000–1.030)
Total Protein, Urine: NEGATIVE
Urine Glucose: NEGATIVE
Urobilinogen, UA: 0.2 (ref 0.0–1.0)
pH: 7 (ref 5.0–8.0)

## 2021-09-20 LAB — COMPREHENSIVE METABOLIC PANEL
ALT: 10 U/L (ref 0–35)
AST: 15 U/L (ref 0–37)
Albumin: 4.5 g/dL (ref 3.5–5.2)
Alkaline Phosphatase: 46 U/L (ref 39–117)
BUN: 13 mg/dL (ref 6–23)
CO2: 29 mEq/L (ref 19–32)
Calcium: 9.2 mg/dL (ref 8.4–10.5)
Chloride: 103 mEq/L (ref 96–112)
Creatinine, Ser: 0.65 mg/dL (ref 0.40–1.20)
GFR: 100.17 mL/min (ref >60.00)
Glucose, Bld: 83 mg/dL (ref 70–99)
Potassium: 4.6 mEq/L (ref 3.5–5.1)
Sodium: 138 mEq/L (ref 135–145)
Total Bilirubin: 0.4 mg/dL (ref 0.2–1.2)
Total Protein: 6.6 g/dL (ref 6.0–8.3)

## 2021-09-20 LAB — CBC WITH DIFFERENTIAL/PLATELET
Basophils Absolute: 0.1 10*3/uL (ref 0.0–0.1)
Basophils Relative: 1 % (ref 0.0–3.0)
Eosinophils Absolute: 0.3 10*3/uL (ref 0.0–0.7)
Eosinophils Relative: 5.8 % — ABNORMAL HIGH (ref 0.0–5.0)
HCT: 42.2 % (ref 36.0–46.0)
Hemoglobin: 14 g/dL (ref 12.0–15.0)
Lymphocytes Relative: 24.6 % (ref 12.0–46.0)
Lymphs Abs: 1.2 10*3/uL (ref 0.7–4.0)
MCHC: 33.2 g/dL (ref 30.0–36.0)
MCV: 94.3 fl (ref 78.0–100.0)
Monocytes Absolute: 0.4 10*3/uL (ref 0.1–1.0)
Monocytes Relative: 7.1 % (ref 3.0–12.0)
Neutro Abs: 3.1 10*3/uL (ref 1.4–7.7)
Neutrophils Relative %: 61.5 % (ref 43.0–77.0)
Platelets: 217 10*3/uL (ref 150.0–400.0)
RBC: 4.47 Mil/uL (ref 3.87–5.11)
RDW: 14.5 % (ref 11.5–15.5)
WBC: 5 10*3/uL (ref 4.0–10.5)

## 2021-09-20 LAB — LIPID PANEL
Cholesterol: 193 mg/dL (ref 0–200)
HDL: 62.5 mg/dL (ref >39.00)
LDL Cholesterol: 116 mg/dL — ABNORMAL HIGH (ref 0–99)
NonHDL: 130.55
Total CHOL/HDL Ratio: 3
Triglycerides: 74 mg/dL (ref 0.0–149.0)
VLDL: 14.8 mg/dL (ref 0.0–40.0)

## 2021-09-20 LAB — TSH: TSH: 2.57 u[IU]/mL (ref 0.35–5.50)

## 2021-10-28 ENCOUNTER — Ambulatory Visit
Admission: RE | Admit: 2021-10-28 | Discharge: 2021-10-28 | Disposition: A | Discharge status: Home / Self Care | Payer: BC Managed Care – PPO | Source: Ambulatory Visit | Attending: Internal Medicine | Admitting: Internal Medicine

## 2021-10-28 DIAGNOSIS — Z1231 Encounter for screening mammogram for malignant neoplasm of breast: Secondary | ICD-10-CM | POA: Insufficient documentation

## 2021-11-19 ENCOUNTER — Telehealth: Payer: BC Managed Care – PPO | Admitting: Family Medicine

## 2021-11-19 DIAGNOSIS — R519 Headache, unspecified: Secondary | ICD-10-CM | POA: Diagnosis not present

## 2021-11-19 MED ORDER — FLUTICASONE PROPIONATE 50 MCG/ACT NA SUSP: 2.0000 | Freq: Every day | NASAL | 0 refills | Status: DC

## 2021-11-19 MED ORDER — RIZATRIPTAN BENZOATE 10 MG PO TABS: 10.0000 mg | ORAL_TABLET | ORAL | 0 refills | Status: DC | PRN

## 2021-11-19 NOTE — Progress Notes (Signed)
Virtual Visit Consent   REMY DIA, you are scheduled for a virtual visit with a [facility] provider today. Just as with appointments in the office, your consent must be obtained to participate. Your consent will be active for this visit and any virtual visit you may have with one of our providers in the next 365 days. If you have a MyChart account, a copy of this consent can be sent to you electronically.  As this is a virtual visit, video technology does not allow for your provider to perform a traditional examination. This may limit your provider's ability to fully assess your condition. If your provider identifies any concerns that need to be evaluated in person or the need to arrange testing (such as labs, EKG, etc.), we will make arrangements to do so. Although advances in technology are sophisticated, we cannot ensure that it will always work on either your end or our end. If the connection with a video visit is poor, the visit may have to be switched to a telephone visit. With either a video or telephone visit, we are not always able to ensure that we have a secure connection.  By engaging in this virtual visit, you consent to the provision of healthcare and authorize for your insurance to be billed (if applicable) for the services provided during this visit. Depending on your insurance coverage, you may receive a charge related to this service.  I need to obtain your verbal consent now. Are you willing to proceed with your visit today? [name] has provided verbal consent on 11/19/2021 for a virtual visit (video or telephone). Perlie Mayo, NP  Date: 11/19/2021 10:34 AM  Virtual Visit via Video Note   I, Perlie Mayo, connected with  Deanna Young  ([id number], 08-Feb-1966) on 11/19/21 at 11:45 AM EDT by a video-enabled telemedicine application and verified that I am speaking with the correct person using two identifiers.  Location: Patient: Virtual Visit Location Patient:  Home Provider: Virtual Visit Location Provider: Home Office   I discussed the limitations of evaluation and management by telemedicine and the availability of in person appointments. The patient expressed understanding and agreed to proceed.    History of Present Illness: Deanna Young is a 56 y.o. who identifies as a female who was assigned female at birth, and is being seen today for sinus infection. She went to dentist and was not tooth infection, was given amoxicillin from him. He also reported it seems to be sinus.  Reports a headache on going for 4 days without relief with use of ibuprofen. Wants to know if this could be just related to sinus infection and if anything can be used to help.  Denies vision changes, n/v, light or hearing sensitivities, weakness of any kind. No Hx of migraines, but has had headaches before and the usually respond to ibuprofen use.    Problems:  Patient Active Problem List   Diagnosis Date Noted   Uterine fibroid 05/10/2020   Abnormal CT scan, colon 05/10/2020   Motion sickness 12/12/2019   Annual physical exam 12/03/2018   Depression    HLD (hyperlipidemia)    Endometriosis 08/07/2015   Premature ovarian failure 08/07/2015   Obesity (BMI 30-39.9) 08/07/2015   Status post subtotal hysterectomy 08/07/2015    Allergies: No Known Allergies Medications:  Current Outpatient Medications:    calcium-vitamin D 250-100 MG-UNIT tablet, Take 1 tablet by mouth 2 (two) times daily., Disp: , Rfl:    dicyclomine (  BENTYL) 10 MG capsule, Take 1-2 capsules (10-20 mg total) by mouth every 8 (eight) hours as needed for spasms., Disp: 60 capsule, Rfl: 1   estradiol (VIVELLE-DOT) 0.1 MG/24HR patch, Place 1 patch (0.1 mg total) onto the skin 2 (two) times a week., Disp: 24 patch, Rfl: 3  Observations/Objective: Patient is well-developed, well-nourished in no acute distress.  Resting comfortably  at home.  Head is normocephalic, atraumatic.  No labored breathing.   Speech is clear and coherent with logical content.  Patient is alert and oriented at baseline.    Assessment and Plan: 1. Sinus headache  - rizatriptan (MAXALT) 10 MG tablet; Take 1 tablet (10 mg total) by mouth as needed for migraine. May repeat in 2 hours if needed  Dispense: 10 tablet; Refill: 0  -possible sinus headache/pressure radiating Diffs include- covid, temporal arteritis, other etiology of brain/face/sinuses. -advised to continue amoxicillin 500 mg TID and will try an abortive med for a possible migraine. -use of flonase might also help  -strict in person precautions reviewed -no red flags during visit   Reviewed side effects, risks and benefits of medication.    Patient acknowledged agreement and understanding of the plan.   Past Medical, Surgical, Social History, Allergies, and Medications have been Reviewed.    Follow Up Instructions: I discussed the assessment and treatment plan with the patient. The patient was provided an opportunity to ask questions and all were answered. The patient agreed with the plan and demonstrated an understanding of the instructions.  A copy of instructions were sent to the patient via MyChart unless otherwise noted below.     The patient was advised to call back or seek an in-person evaluation if the symptoms worsen or if the condition fails to improve as anticipated.  Time:  I spent 13 minutes with the patient via telehealth technology discussing the above problems/concerns.    Perlie Mayo, NP

## 2021-11-19 NOTE — Patient Instructions (Addendum)
Pricilla Handler, thank you for joining Perlie Mayo, NP for today's virtual visit.  While this provider is not your primary care provider (PCP), if your PCP is located in our provider database this encounter information will be shared with them immediately following your visit.  Consent: (Patient) Deanna Young provided verbal consent for this virtual visit at the beginning of the encounter.  Current Medications:  Current Outpatient Medications:    rizatriptan (MAXALT) 10 MG tablet, Take 1 tablet (10 mg total) by mouth as needed for migraine. May repeat in 2 hours if needed, Disp: 10 tablet, Rfl: 0   calcium-vitamin D 250-100 MG-UNIT tablet, Take 1 tablet by mouth 2 (two) times daily., Disp: , Rfl:    dicyclomine (BENTYL) 10 MG capsule, Take 1-2 capsules (10-20 mg total) by mouth every 8 (eight) hours as needed for spasms., Disp: 60 capsule, Rfl: 1   estradiol (VIVELLE-DOT) 0.1 MG/24HR patch, Place 1 patch (0.1 mg total) onto the skin 2 (two) times a week., Disp: 24 patch, Rfl: 3   Medications ordered in this encounter:  Meds ordered this encounter  Medications   rizatriptan (MAXALT) 10 MG tablet    Sig: Take 1 tablet (10 mg total) by mouth as needed for migraine. May repeat in 2 hours if needed    Dispense:  10 tablet    Refill:  0    Order Specific Question:   Supervising Provider    Answer:   Chase Picket [[id number]]     *If you need refills on other medications prior to your next appointment, please contact your pharmacy*  Follow-Up: Call back or seek an in-person evaluation if the symptoms worsen or if the condition fails to improve as anticipated.  Other Instructions  Use Flonase to help with sinus pressure Continue medications as ordered    Sinus Pain  Sinus pain happens when your sinuses get swollen or blocked (clogged). Sinuses are spaces behind the bones of your face and forehead. You may feel pain or pressure in your face, forehead, ears, or upper teeth.  Sinus pain can be mild or very bad. What are the causes? Sinus pain can result from conditions that affect your sinuses. Common causes include: Colds. Sinus infections. Allergies. What are the signs or symptoms? The main symptom of this condition is pain or pressure in your face, forehead, ears, or upper teeth. People who have sinus pain often have other symptoms, such as: Stuffed up or runny nose. Fever. Not being able to smell. Headache. Weather changes can make your symptoms worse. How is this treated? Treatment for this condition depends on the cause. Sinus pain caused by: A sinus infection may be treated with antibiotic medicine. A stuffy nose may be helped by rinsing out the nose and sinuses with a salt water solution. Allergies may be helped by allergy medicines and nasal sprays. Surgery may be needed in some cases if other treatments do not help. Follow these instructions at home: General instructions If told: Apply a warm, moist washcloth to your face. This can help to lessen pain. Use a nasal salt water wash. Follow the directions on the bottle or box. Hydrate and humidify Drink enough water to keep your pee (urine) pale yellow. Use a humidifier if your home is dry. Breathe in steam for 10-15 minutes, 3-4 times a day or as told by your doctor. You can do this in the bathroom while a hot shower is running. Try not to spend time in cool or  dry air. Medicines  Take over-the-counter and prescription medicines only as told by your doctor. If you were prescribed an antibiotic medicine, take it as told by your doctor. Do not stop taking it even if you start to feel better. Use a nose spray if your nose feels full of mucus (congested). Contact a doctor if: You get more than one headache a week. Light or sound bothers you. You have a fever. You feel sick to your stomach (nauseous) or you vomit. Your headaches do not get better with treatment. Get help right away if: You have  trouble seeing. You suddenly have very bad pain in your face or head. You start to have quick, sudden movements or shaking that you cannot control (seizure). You are confused. You have a stiff neck. Summary Sinus pain happens when your sinuses get swollen or blocked (clogged). Sinuses are spaces behind the bones of your face and forehead. You may feel pain or pressure in your face, forehead, ears, or upper teeth. Take over-the-counter and prescription medicines only as told by your doctor. If told, apply a warm, moist washcloth to your face. This can help to lessen pain. This information is not intended to replace advice given to you by your health care provider. Make sure you discuss any questions you have with your health care provider. Document Revised: 01/20/2021 Document Reviewed: 01/20/2021 Elsevier Patient Education  [place].    If you have been instructed to have an in-person evaluation today at a local Urgent Care facility, please use the link below. It will take you to a list of all of our available [facility], including address, phone number and hours of operation. Please do not delay care.  [facility]  If you or a family member do not have a primary care provider, use the link below to schedule a visit and establish care. When you choose a [facility] primary care physician or advanced practice provider, you gain a long-term partner in health. Find a Primary Care Provider  Learn more about [facility]'s in-office and virtual care options: [facility]

## 2021-11-20 ENCOUNTER — Encounter: Payer: Self-pay | Admitting: Internal Medicine

## 2021-11-20 ENCOUNTER — Ambulatory Visit (INDEPENDENT_AMBULATORY_CARE_PROVIDER_SITE_OTHER): Payer: BC Managed Care – PPO | Admitting: Internal Medicine

## 2021-11-20 VITALS — BP 118/82 | HR 71 | Temp 98.2°F | Ht 62.01 in | Wt 138.0 lb

## 2021-11-20 DIAGNOSIS — J0110 Acute frontal sinusitis, unspecified: Secondary | ICD-10-CM

## 2021-11-20 DIAGNOSIS — G4452 New daily persistent headache (NDPH): Secondary | ICD-10-CM | POA: Diagnosis not present

## 2021-11-20 MED ORDER — METHYLPREDNISOLONE ACETATE 40 MG/ML IJ SUSP
40.0000 mg | Freq: Once | INTRAMUSCULAR | Status: AC
  Administered 2021-11-20: 40 mg via INTRAMUSCULAR

## 2021-11-20 MED ORDER — AMOXICILLIN-POT CLAVULANATE 875-125 MG PO TABS: 1.0000 | ORAL_TABLET | Freq: Two times a day (BID) | ORAL | 0 refills | Status: DC

## 2021-11-20 NOTE — Patient Instructions (Addendum)
Nasal saline Flonase  Otc zyrtec at night  Sinus Infection, Adult A sinus infection, also called sinusitis, is inflammation of your sinuses. Sinuses are hollow spaces in the bones around your face. Your sinuses are located: Around your eyes. In the middle of your forehead. Behind your nose. In your cheekbones. Mucus normally drains out of your sinuses. When your nasal tissues become inflamed or swollen, mucus can become trapped or blocked. This allows bacteria, viruses, and fungi to grow, which leads to infection. Most infections of the sinuses are caused by a virus. A sinus infection can develop quickly. It can last for up to 4 weeks (acute) or for more than 12 weeks (chronic). A sinus infection often develops after a cold. What are the causes? This condition is caused by anything that creates swelling in the sinuses or stops mucus from draining. This includes: Allergies. Asthma. Infection from bacteria or viruses. Deformities or blockages in your nose or sinuses. Abnormal growths in the nose (nasal polyps). Pollutants, such as chemicals or irritants in the air. Infection from fungi. This is rare. What increases the risk? You are more likely to develop this condition if you: Have a weak body defense system (immune system). Do a lot of swimming or diving. Overuse nasal sprays. Smoke. What are the signs or symptoms? The main symptoms of this condition are pain and a feeling of pressure around the affected sinuses. Other symptoms include: Stuffy nose or congestion that makes it difficult to breathe through your nose. Thick yellow or greenish drainage from your nose. Tenderness, swelling, and warmth over the affected sinuses. A cough that may get worse at night. Decreased sense of smell and taste. Extra mucus that collects in the throat or the back of the nose (postnasal drip) causing a sore throat or bad breath. Tiredness (fatigue). Fever. How is this diagnosed? This condition is  diagnosed based on: Your symptoms. Your medical history. A physical exam. Tests to find out if your condition is acute or chronic. This may include: Checking your nose for nasal polyps. Viewing your sinuses using a device that has a light (endoscope). Testing for allergies or bacteria. Imaging tests, such as an MRI or CT scan. In rare cases, a bone biopsy may be done to rule out more serious types of fungal sinus disease. How is this treated? Treatment for a sinus infection depends on the cause and whether your condition is chronic or acute. If caused by a virus, your symptoms should go away on their own within 10 days. You may be given medicines to relieve symptoms. They include: Medicines that shrink swollen nasal passages (decongestants). A spray that eases inflammation of the nostrils (topical intranasal corticosteroids). Rinses that help get rid of thick mucus in your nose (nasal saline washes). Medicines that treat allergies (antihistamines). Over-the-counter pain relievers. If caused by bacteria, your health care provider may recommend waiting to see if your symptoms improve. Most bacterial infections will get better without antibiotic medicine. You may be given antibiotics if you have: A severe infection. A weak immune system. If caused by narrow nasal passages or nasal polyps, surgery may be needed. Follow these instructions at home: Medicines Take, use, or apply over-the-counter and prescription medicines only as told by your health care provider. These may include nasal sprays. If you were prescribed an antibiotic medicine, take it as told by your health care provider. Do not stop taking the antibiotic even if you start to feel better. Hydrate and humidify  Drink enough fluid to  keep your urine pale yellow. Staying hydrated will help to thin your mucus. Use a cool mist humidifier to keep the humidity level in your home above 50%. Inhale steam for 10-15 minutes, 3-4 times a  day, or as told by your health care provider. You can do this in the bathroom while a hot shower is running. Limit your exposure to cool or dry air. Rest Rest as much as possible. Sleep with your head raised (elevated). Make sure you get enough sleep each night. General instructions  Apply a warm, moist washcloth to your face 3-4 times a day or as told by your health care provider. This will help with discomfort. Use nasal saline washes as often as told by your health care provider. Wash your hands often with soap and water to reduce your exposure to germs. If soap and water are not available, use hand sanitizer. Do not smoke. Avoid being around people who are smoking (secondhand smoke). Keep all follow-up visits. This is important. Contact a health care provider if: You have a fever. Your symptoms get worse. Your symptoms do not improve within 10 days. Get help right away if: You have a severe headache. You have persistent vomiting. You have severe pain or swelling around your face or eyes. You have vision problems. You develop confusion. Your neck is stiff. You have trouble breathing. These symptoms may be an emergency. Get help right away. Call 911. Do not wait to see if the symptoms will go away. Do not drive yourself to the hospital. Summary A sinus infection is soreness and inflammation of your sinuses. Sinuses are hollow spaces in the bones around your face. This condition is caused by nasal tissues that become inflamed or swollen. The swelling traps or blocks the flow of mucus. This allows bacteria, viruses, and fungi to grow, which leads to infection. If you were prescribed an antibiotic medicine, take it as told by your health care provider. Do not stop taking the antibiotic even if you start to feel better. Keep all follow-up visits. This is important. This information is not intended to replace advice given to you by your health care provider. Make sure you discuss any  questions you have with your health care provider. Document Revised: 01/22/2021 Document Reviewed: 01/22/2021 Elsevier Patient Education  [place] Headache Without Cause A headache is pain or discomfort felt around the head or neck area. There are many causes and types of headaches. A few common types include: Tension headaches. Migraine headaches. Cluster headaches. Chronic daily headaches. Sometimes, the specific cause of a headache may not be found. Follow these instructions at home: Watch your condition for any changes. Let your health care provider know about them. Take these steps to help with your condition: Managing pain     Take over-the-counter and prescription medicines only as told by your health care provider. Treatment may include medicines for pain that are taken by mouth or applied to the skin. Lie down in a dark, quiet room when you have a headache. Keep lights dim if bright lights bother you or make your headaches worse. If directed, put ice on your head and neck area: Put ice in a plastic bag. Place a towel between your skin and the bag. Leave the ice on for 20 minutes, 2-3 times per day. Remove the ice if your skin turns bright red. This is very important. If you cannot feel pain, heat, or cold, you have a greater risk of damage to the  area. If directed, apply heat to the affected area. Use the heat source that your health care provider recommends, such as a moist heat pack or a heating pad. Place a towel between your skin and the heat source. Leave the heat on for 20-30 minutes. Remove the heat if your skin turns bright red. This is especially important if you are unable to feel pain, heat, or cold. You have a greater risk of getting burned. Eating and drinking Eat meals on a regular schedule. If you drink alcohol: Limit how much you have to: 0-1 drink a day for women who are not pregnant. 0-2 drinks a day for men. Know how much alcohol is  in a drink. In the [place] one drink equals one 12 oz bottle of beer (355 mL), one 5 oz glass of wine (148 mL), or one 1 oz glass of hard liquor (44 mL). Stop drinking caffeine, or decrease the amount of caffeine you drink. Drink enough fluid to keep your urine pale yellow. General instructions  Keep a headache journal to help find out what may trigger your headaches. For example, write down: What you eat and drink. How much sleep you get. Any change to your diet or medicines. Try massage or other relaxation techniques. Limit stress. Sit up straight, and do not tense your muscles. Do not use any products that contain nicotine or tobacco. These products include cigarettes, chewing tobacco, and vaping devices, such as e-cigarettes. If you need help quitting, ask your health care provider. Exercise regularly as told by your health care provider. Sleep on a regular schedule. Get 7-9 hours of sleep each night, or the amount recommended by your health care provider. Keep all follow-up visits. This is important. Contact a health care provider if: Medicine does not help your symptoms. You have a headache that is different from your usual headache. You have nausea or you vomit. You have a fever. Get help right away if: Your headache: Becomes severe quickly. Gets worse after moderate to intense physical activity. You have any of these symptoms: Repeated vomiting. Pain or stiffness in your neck. Changes to your vision. Pain in an eye or ear. Problems with speech. Muscular weakness or loss of muscle control. Loss of balance or coordination. You feel faint or pass out. You have confusion. You have a seizure. These symptoms may represent a serious problem that is an emergency. Do not wait to see if the symptoms will go away. Get medical help right away. Call your local emergency services (911 in the [place] Do not drive yourself to the hospital. Summary A headache is pain or discomfort felt  around the head or neck area. There are many causes and types of headaches. In some cases, the cause may not be found. Keep a headache journal to help find out what may trigger your headaches. Watch your condition for any changes. Let your health care provider know about them. Contact a health care provider if you have a headache that is different from the usual headache, or if your symptoms are not helped by medicine. Get help right away if your headache becomes severe, you vomit, you have a loss of vision, you lose your balance, or you have a seizure. This information is not intended to replace advice given to you by your health care provider. Make sure you discuss any questions you have with your health care provider. Document Revised: 07/18/2020 Document Reviewed: 07/18/2020 Elsevier Patient Education  [place].

## 2021-11-20 NOTE — Progress Notes (Signed)
Chief Complaint  Patient presents with   Headache    Pt is more concerned about her headache versus a sinus infection. Pt was put on amoxicilin Monday and rizatriptan is not helping her headache. Her headache is more on the right temporal side    F/u  1. Sinus infection right sided w/ right sided h/a throbbing since Friday tried advil,tylenol telehealth visit given maxalt took 20 mg yesterday w/o help went to dentist Monday not a tooth issues per Xrays and given amoxil but still with right  nasal congestion and throbbing h/a unable to work or do anything since Friday     Review of Systems  Constitutional:  Negative for weight loss.  HENT:  Negative for hearing loss.   Eyes:  Negative for blurred vision.  Respiratory:  Negative for shortness of breath.   Cardiovascular:  Negative for chest pain.  Gastrointestinal:  Negative for abdominal pain and blood in stool.  Genitourinary:  Negative for dysuria.  Musculoskeletal:  Negative for falls and joint pain.  Skin:  Negative for rash.  Neurological:  Positive for headaches.  Psychiatric/Behavioral:  Negative for depression.    Past Medical History:  Diagnosis Date   Allergies    COVID-19    summer 2020, xmas 2021   Decreased sex drive    Depression    Diverticulosis 2019   GERD (gastroesophageal reflux disease)    HLD (hyperlipidemia)    Hx of adenomatous colonic polyps 11/2017   8 mm sessile serrated/adenoma in descending colon   Hypercholesteremia    Osteopenia    Premature ovarian failure    UTI (urinary tract infection)    Past Surgical History:  Procedure Laterality Date   ABDOMINAL HYSTERECTOMY     lsh   CHOLECYSTECTOMY     TONSILLECTOMY AND ADENOIDECTOMY     Family History  Problem Relation Age of Onset   Osteoporosis Mother    Diabetes Mother    Depression Mother    Diabetes gravidarum Mother    Heart attack Mother    Hypertension Mother    Hyperlipidemia Mother    Mental illness Mother    Drug abuse Father     Hearing loss Father    Hyperlipidemia Father    Alcohol abuse Brother    Drug abuse Brother    Diverticulitis Brother    Diverticulitis Maternal Aunt    Heart disease Maternal Grandfather    Cancer Neg Hx    Ovarian cancer Neg Hx    Colon polyps Neg Hx    Breast cancer Neg Hx    Colon cancer Neg Hx    Stomach cancer Neg Hx    Rectal cancer Neg Hx    Esophageal cancer Neg Hx    Social History   Socioeconomic History   Marital status: Married    Spouse name: Not on file   Number of children: 2   Years of education: Not on file   Highest education level: Not on file  Occupational History   Not on file  Tobacco Use   Smoking status: Never   Smokeless tobacco: Never  Vaping Use   Vaping Use: Never used  Substance and Sexual Activity   Alcohol use: Yes    Comment: social   Drug use: No   Sexual activity: Yes    Birth control/protection: Surgical  Other Topics Concern   Not on file  Social History Narrative   Tennis, golf pickle ball    Married    Social Determinants of  Health   Financial Resource Strain: Not on file  Food Insecurity: Not on file  Transportation Needs: Not on file  Physical Activity: Sufficiently Active (08/19/2017)   Exercise Vital Sign    Days of Exercise per Week: 5 days    Minutes of Exercise per Session: 60 min  Stress: Not on file  Social Connections: Not on file  Intimate Partner Violence: Not on file   Current Meds  Medication Sig   amoxicillin-clavulanate (AUGMENTIN) 875-125 MG tablet Take 1 tablet by mouth 2 (two) times daily. With food   No Known Allergies Recent Results (from the past 2160 hour(s))  TSH     Status: None   Collection Time: 09/20/21  8:39 AM  Result Value Ref Range   TSH 2.57 0.35 - 5.50 uIU/mL  CBC with Differential/Platelet     Status: Abnormal   Collection Time: 09/20/21  8:39 AM  Result Value Ref Range   WBC 5.0 4.0 - 10.5 K/uL   RBC 4.47 3.87 - 5.11 Mil/uL   Hemoglobin 14.0 12.0 - 15.0 g/dL   HCT 42.2  36.0 - 46.0 %   MCV 94.3 78.0 - 100.0 fl   MCHC 33.2 30.0 - 36.0 g/dL   RDW 14.5 11.5 - 15.5 %   Platelets 217.0 150.0 - 400.0 K/uL   Neutrophils Relative % 61.5 43.0 - 77.0 %   Lymphocytes Relative 24.6 12.0 - 46.0 %   Monocytes Relative 7.1 3.0 - 12.0 %   Eosinophils Relative 5.8 (H) 0.0 - 5.0 %   Basophils Relative 1.0 0.0 - 3.0 %   Neutro Abs 3.1 1.4 - 7.7 K/uL   Lymphs Abs 1.2 0.7 - 4.0 K/uL   Monocytes Absolute 0.4 0.1 - 1.0 K/uL   Eosinophils Absolute 0.3 0.0 - 0.7 K/uL   Basophils Absolute 0.1 0.0 - 0.1 K/uL  Lipid panel     Status: Abnormal   Collection Time: 09/20/21  8:39 AM  Result Value Ref Range   Cholesterol 193 0 - 200 mg/dL    Comment: ATP III Classification       Desirable:  < 200 mg/dL               Borderline High:  200 - 239 mg/dL          High:  > = 240 mg/dL   Triglycerides 74.0 0.0 - 149.0 mg/dL    Comment: Normal:  <150 mg/dLBorderline High:  150 - 199 mg/dL   HDL 62.50 >39.00 mg/dL   VLDL 14.8 0.0 - 40.0 mg/dL   LDL Cholesterol 116 (H) 0 - 99 mg/dL   Total CHOL/HDL Ratio 3     Comment:                Men          Women1/2 Average Risk     3.4          3.3Average Risk          5.0          4.42X Average Risk          9.6          7.13X Average Risk          15.0          11.0                       NonHDL 130.55     Comment: NOTE:  Non-HDL goal should be  30 mg/dL higher than patient's LDL goal (i.e. LDL goal of < 70 mg/dL, would have non-HDL goal of < 100 mg/dL)  Comprehensive metabolic panel     Status: None   Collection Time: 09/20/21  8:39 AM  Result Value Ref Range   Sodium 138 135 - 145 mEq/L   Potassium 4.6 3.5 - 5.1 mEq/L   Chloride 103 96 - 112 mEq/L   CO2 29 19 - 32 mEq/L   Glucose, Bld 83 70 - 99 mg/dL   BUN 13 6 - 23 mg/dL   Creatinine, Ser 0.65 0.40 - 1.20 mg/dL   Total Bilirubin 0.4 0.2 - 1.2 mg/dL   Alkaline Phosphatase 46 39 - 117 U/L   AST 15 0 - 37 U/L   ALT 10 0 - 35 U/L   Total Protein 6.6 6.0 - 8.3 g/dL   Albumin 4.5 3.5 - 5.2  g/dL   GFR 100.17 >60.00 mL/min    Comment: Calculated using the CKD-EPI Creatinine Equation (2021)   Calcium 9.2 8.4 - 10.5 mg/dL  Urinalysis, Routine w reflex microscopic     Status: Abnormal   Collection Time: 09/20/21  8:41 AM  Result Value Ref Range   Color, Urine YELLOW Yellow;Lt. Yellow;Straw;Dark Yellow;Amber;Green;Red;Brown   APPearance CLEAR Clear;Turbid;Slightly Cloudy;Cloudy   Specific Gravity, Urine <=1.005 (A) 1.000 - 1.030   pH 7.0 5.0 - 8.0   Total Protein, Urine NEGATIVE Negative   Urine Glucose NEGATIVE Negative   Ketones, ur NEGATIVE Negative   Bilirubin Urine NEGATIVE Negative   Hgb urine dipstick TRACE-INTACT (A) Negative   Urobilinogen, UA 0.2 0.0 - 1.0   Leukocytes,Ua TRACE (A) Negative   Nitrite NEGATIVE Negative   WBC, UA 7-10/hpf (A) 0-2/hpf   RBC / HPF 0-2/hpf 0-2/hpf   Squamous Epithelial / LPF Few(5-10/hpf) (A) Rare(0-4/hpf)   Bacteria, UA Rare(<10/hpf) (A) None   Objective  Body mass index is 25.23 kg/m. Wt Readings from Last 3 Encounters:  11/20/21 138 lb (62.6 kg)  09/19/21 144 lb 9.6 oz (65.6 kg)  11/13/20 153 lb (69.4 kg)   Temp Readings from Last 3 Encounters:  11/20/21 98.2 F (36.8 C) (Oral)  09/19/21 98.4 F (36.9 C) (Oral)  11/13/20 97.8 F (36.6 C)   BP Readings from Last 3 Encounters:  11/20/21 118/82  09/19/21 120/72  11/13/20 (!) 100/56   Pulse Readings from Last 3 Encounters:  11/20/21 71  09/19/21 69  11/13/20 69    Physical Exam Vitals and nursing note reviewed.  Constitutional:      Appearance: Normal appearance. She is well-developed and well-groomed.  HENT:     Head: Normocephalic and atraumatic.  Eyes:     Conjunctiva/sclera: Conjunctivae normal.     Pupils: Pupils are equal, round, and reactive to light.  Cardiovascular:     Rate and Rhythm: Normal rate and regular rhythm.     Heart sounds: Normal heart sounds. No murmur heard. Pulmonary:     Effort: Pulmonary effort is normal.     Breath sounds:  Normal breath sounds.  Abdominal:     General: Abdomen is flat. Bowel sounds are normal.     Tenderness: There is no abdominal tenderness.  Musculoskeletal:        General: No tenderness.  Skin:    General: Skin is warm and dry.  Neurological:     General: No focal deficit present.     Mental Status: She is alert and oriented to person, place, and time. Mental status is at baseline.  Cranial Nerves: Cranial nerves 2-12 are intact.     Motor: Motor function is intact.     Coordination: Coordination is intact.     Gait: Gait is intact.  Psychiatric:        Attention and Perception: Attention and perception normal.        Mood and Affect: Mood and affect normal.        Speech: Speech normal.        Behavior: Behavior normal. Behavior is cooperative.        Thought Content: Thought content normal.        Cognition and Memory: Cognition and memory normal.        Judgment: Judgment normal.     Assessment  Plan  New daily persistent headache right sided temporal/jawline per dental not tooth/TMG  On Abx amoxil switch to below  Depomedrol 40 x 1  Maxalt prn and tylenol/advil prn  If not better in 2 days imaging I.e MRI or CT   Acute non-recurrent frontal sinusitis - Plan: amoxicillin-clavulanate (AUGMENTIN) 875-125 MG tablet  Provider: Dr. Olivia Mackie McLean-Scocuzza-[facility]

## 2021-11-20 NOTE — Addendum Note (Signed)
Addended by: Gracy Racer on: 11/20/2021 02:22 PM   Modules accepted: Orders

## 2021-11-20 NOTE — Progress Notes (Signed)
DepoMedrol 40 mg injection was given to pt in the left deltoid. Pt tolerated shot well.  [place], CMA

## 2021-11-22 ENCOUNTER — Emergency Department (HOSPITAL_COMMUNITY)
Admission: EM | Admit: 2021-11-22 | Discharge: 2021-11-22 | Disposition: A | Discharge status: Home / Self Care | Payer: BC Managed Care – PPO | Attending: Emergency Medicine | Admitting: Emergency Medicine

## 2021-11-22 ENCOUNTER — Telehealth: Payer: Self-pay

## 2021-11-22 ENCOUNTER — Telehealth: Payer: Self-pay | Admitting: Internal Medicine

## 2021-11-22 ENCOUNTER — Emergency Department (HOSPITAL_COMMUNITY): Payer: BC Managed Care – PPO

## 2021-11-22 ENCOUNTER — Other Ambulatory Visit: Payer: Self-pay

## 2021-11-22 ENCOUNTER — Encounter (HOSPITAL_COMMUNITY): Payer: Self-pay | Admitting: Emergency Medicine

## 2021-11-22 DIAGNOSIS — R202 Paresthesia of skin: Secondary | ICD-10-CM | POA: Diagnosis not present

## 2021-11-22 DIAGNOSIS — R519 Headache, unspecified: Secondary | ICD-10-CM | POA: Diagnosis not present

## 2021-11-22 DIAGNOSIS — R9431 Abnormal electrocardiogram [ECG] [EKG]: Secondary | ICD-10-CM | POA: Diagnosis not present

## 2021-11-22 DIAGNOSIS — R011 Cardiac murmur, unspecified: Secondary | ICD-10-CM | POA: Insufficient documentation

## 2021-11-22 DIAGNOSIS — R7982 Elevated C-reactive protein (CRP): Secondary | ICD-10-CM | POA: Diagnosis not present

## 2021-11-22 LAB — COMPREHENSIVE METABOLIC PANEL
ALT: 11 U/L (ref 0–44)
AST: 22 U/L (ref 15–41)
Albumin: 4.1 g/dL (ref 3.5–5.0)
Alkaline Phosphatase: 45 U/L (ref 38–126)
Anion gap: 8 (ref 5–15)
BUN: 10 mg/dL (ref 6–20)
CO2: 26 mmol/L (ref 22–32)
Calcium: 9.4 mg/dL (ref 8.9–10.3)
Chloride: 104 mmol/L (ref 98–111)
Creatinine, Ser: 0.76 mg/dL (ref 0.44–1.00)
GFR, Estimated: >60 mL/min (ref >60)
Glucose, Bld: 97 mg/dL (ref 70–99)
Potassium: 4 mmol/L (ref 3.5–5.1)
Sodium: 138 mmol/L (ref 135–145)
Total Bilirubin: 0.7 mg/dL (ref 0.3–1.2)
Total Protein: 7.3 g/dL (ref 6.5–8.1)

## 2021-11-22 LAB — CBC WITH DIFFERENTIAL/PLATELET
Abs Immature Granulocytes: 0.02 10*3/uL (ref 0.00–0.07)
Basophils Absolute: 0.1 10*3/uL (ref 0.0–0.1)
Basophils Relative: 1 %
Eosinophils Absolute: 0.3 10*3/uL (ref 0.0–0.5)
Eosinophils Relative: 4 %
HCT: 42.9 % (ref 36.0–46.0)
Hemoglobin: 14.3 g/dL (ref 12.0–15.0)
Immature Granulocytes: 0 %
Lymphocytes Relative: 25 %
Lymphs Abs: 1.8 10*3/uL (ref 0.7–4.0)
MCH: 31 pg (ref 26.0–34.0)
MCHC: 33.3 g/dL (ref 30.0–36.0)
MCV: 93.1 fL (ref 80.0–100.0)
Monocytes Absolute: 0.4 10*3/uL (ref 0.1–1.0)
Monocytes Relative: 6 %
Neutro Abs: 4.6 10*3/uL (ref 1.7–7.7)
Neutrophils Relative %: 64 %
Platelets: 296 10*3/uL (ref 150–400)
RBC: 4.61 MIL/uL (ref 3.87–5.11)
RDW: 13.1 % (ref 11.5–15.5)
WBC: 7.2 10*3/uL (ref 4.0–10.5)
nRBC: 0 % (ref 0.0–0.2)

## 2021-11-22 LAB — C-REACTIVE PROTEIN: CRP: 1.1 mg/dL — ABNORMAL HIGH (ref <1.0)

## 2021-11-22 LAB — TROPONIN I (HIGH SENSITIVITY)
Troponin I (High Sensitivity): <2 ng/L (ref <18)
Troponin I (High Sensitivity): 2 ng/L (ref <18)

## 2021-11-22 LAB — SEDIMENTATION RATE: Sed Rate: 21 mm/hr (ref 0–22)

## 2021-11-22 MED ORDER — IOHEXOL 350 MG/ML SOLN
75.0000 mL | Freq: Once | INTRAVENOUS | Status: AC | PRN
  Administered 2021-11-22: 75 mL via INTRAVENOUS

## 2021-11-22 MED ORDER — KETOROLAC TROMETHAMINE 30 MG/ML IJ SOLN
15.0000 mg | Freq: Once | INTRAMUSCULAR | Status: AC
  Administered 2021-11-22: 15 mg via INTRAVENOUS
  Filled 2021-11-22: qty 1

## 2021-11-22 NOTE — ED Provider Notes (Signed)
[facility] EMERGENCY DEPARTMENT Provider Note   CSN: [id number] Arrival date & time: 11/22/21  9417     History  Chief Complaint  Patient presents with   Tingling    Deanna Young is a 56 y.o. female.  56 year old female without significant past medical history who presents the ER today secondary to a couple different symptoms.  She has been having a right-sided headache around the temporal area for the last week or so.  She was seen by her doctor on a telehealth visit and then again in the office and was diagnosed with sinusitis and started on antibiotics.  She states that she has had sinusitis to cause headaches in the past.  However tonight she woke up with left arm tingling and numbness in a patch on her brachial radialis area.  She states that she also had some tingling in a few of her fingers.  These have since resolved.  She is unsure if this is anxiety or if it is related to her headache or if it is cardiac in nature.  She has no vision changes, jaw claudication.  She saw a dentist who also thought it might be her sinuses as she did not have any dental issues at that time.  No fevers.  No other associated symptoms.        Home Medications Prior to Admission medications   Medication Sig Start Date End Date Taking? Authorizing Provider  amoxicillin-clavulanate (AUGMENTIN) 875-125 MG tablet Take 1 tablet by mouth 2 (two) times daily. With food 11/20/21   McLean-Scocuzza, Nino Glow, MD  calcium-vitamin D 250-100 MG-UNIT tablet Take 1 tablet by mouth 2 (two) times daily.    [provider]  dicyclomine (BENTYL) 10 MG capsule Take 1-2 capsules (10-20 mg total) by mouth every 8 (eight) hours as needed for spasms. 10/24/20   Armbruster, Carlota Raspberry, MD  estradiol (VIVELLE-DOT) 0.1 MG/24HR patch Place 1 patch (0.1 mg total) onto the skin 2 (two) times a week. 08/20/17   Defrancesco, Alanda Slim, MD  fluticasone (FLONASE) 50 MCG/ACT nasal spray Place 2 sprays into both  nostrils daily. 11/19/21   Perlie Mayo, NP  rizatriptan (MAXALT) 10 MG tablet Take 1 tablet (10 mg total) by mouth as needed for migraine. May repeat in 2 hours if needed 11/19/21   Perlie Mayo, NP      Allergies    Gramineae pollens    Review of Systems   Review of Systems  Physical Exam Updated Vital Signs BP 109/87   Pulse 96   Temp 97.9 F (36.6 C)   Resp 17   SpO2 100%  Physical Exam Vitals and nursing note reviewed.  Constitutional:      Appearance: She is well-developed.  HENT:     Head: Normocephalic and atraumatic.  Eyes:     Pupils: Pupils are equal, round, and reactive to light.  Cardiovascular:     Rate and Rhythm: Normal rate and regular rhythm.     Heart sounds: Murmur heard.  Pulmonary:     Effort: No respiratory distress.     Breath sounds: No stridor.  Abdominal:     General: Abdomen is flat. There is no distension.  Musculoskeletal:     Cervical back: Normal range of motion.  Skin:    General: Skin is warm and dry.  Neurological:     General: No focal deficit present.     Mental Status: She is alert.     Comments: No  altered mental status, able to give full seemingly accurate history.  Face is symmetric, EOM's intact, pupils equal and reactive, vision intact, tongue and uvula midline without deviation. Upper and Lower extremity motor 5/5, intact pain perception in distal extremities, 2+ reflexes in biceps, patella and achilles tendons. Able to perform finger to nose normal with both hands. Walks without assistance or evident ataxia.       ED Results / Procedures / Treatments   Labs (all labs ordered are listed, but only abnormal results are displayed) Labs Reviewed  C-REACTIVE PROTEIN - Abnormal; Notable for the following components:      Result Value   CRP 1.1 (*)    All other components within normal limits  CBC WITH DIFFERENTIAL/PLATELET  COMPREHENSIVE METABOLIC PANEL  SEDIMENTATION RATE  TROPONIN I (HIGH SENSITIVITY)  TROPONIN I  (HIGH SENSITIVITY)    EKG None  Radiology CT Angio Head W or Wo Contrast  Result Date: 11/22/2021 CLINICAL DATA:  Headache, new or worsening, neuro deficit. EXAM: CT ANGIOGRAPHY HEAD TECHNIQUE: Multidetector CT imaging of the head was performed using the standard protocol during bolus administration of intravenous contrast. Multiplanar CT image reconstructions and MIPs were obtained to evaluate the vascular anatomy. RADIATION DOSE REDUCTION: This exam was performed according to the departmental dose-optimization program which includes automated exposure control, adjustment of the mA and/or kV according to patient size and/or use of iterative reconstruction technique. CONTRAST:  41m OMNIPAQUE IOHEXOL 350 MG/ML SOLN COMPARISON:  None Available. FINDINGS: CT HEAD Brain: No evidence of acute infarction, hemorrhage, hydrocephalus, extra-axial collection or mass lesion/mass effect. Vascular: No hyperdense vessel or unexpected calcification. Skull: Normal. Negative for fracture or focal lesion. Sinuses: Partial coverage shows right ethmoid and bilateral maxillary opacification. The right frontal sinus is aplastic. A left maxillary fluid levels present. CTA HEAD Anterior circulation: No significant stenosis, proximal occlusion, aneurysm, or vascular malformation. Posterior circulation: No significant stenosis, proximal occlusion, aneurysm, or vascular malformation. Venous sinuses: Negative when accounting for defects from arachnoid granulation. Anatomic variants: Fetal type left PCA. Review of the MIP images confirms the above findings. IMPRESSION: 1. Negative CTA.  Normal appearance of the brain. 2. Partial coverage of bilateral sinusitis. Electronically Signed   By: JJorje GuildM.D.   On: 11/22/2021 05:57   DG Chest 2 View  Result Date: 11/22/2021 CLINICAL DATA:  Evaluate heart murmur EXAM: CHEST - 2 VIEW COMPARISON:  None Available. FINDINGS: Normal heart size and mediastinal contours. No acute  infiltrate or edema. No effusion or pneumothorax. No acute osseous findings. Artifact from EKG leads. Cholecystectomy IMPRESSION: Negative chest. Electronically Signed   By: JJorje GuildM.D.   On: 11/22/2021 04:54    Procedures Procedures    Medications Ordered in ED Medications  iohexol (OMNIPAQUE) 350 MG/ML injection 75 mL (75 mLs Intravenous Contrast Given 11/22/21 0548)  ketorolac (TORADOL) 30 MG/ML injection 15 mg (15 mg Intravenous Given 11/22/21 0654)    ED Course/ Medical Decision Making/ A&P                           Medical Decision Making Amount and/or Complexity of Data Reviewed Labs: ordered. Radiology: ordered.  Risk Prescription drug management.   Considered possible temporal arteritis for her symptoms however CRP is barely elevated at 1.1 her ESR is negative she has no white count and there risk factors for temporal arteritis.  I think this is less likely at this time and continue follow-up with her doctor. Also considered  aneurysm versus bleed versus other intracranial causes such as stroke especially with the new paresthesias however the paresthesias are very atypical.  CT angio done that was negative.  Also consider possible cardiac causes for her left arm symptoms as she is a 56 year old female and could be presenting atypically however troponins were negative and EKG was reassuring.  In the end of not 100% sure what is causing her symptoms however I do not see any etiology that has not been worked up that would be considered an emergency.  Toradol given to try to help with her headache as she declined to receive the rest of the headache cocktail.  Stable for discharge with PCP follow-up.  Final Clinical Impression(s) / ED Diagnoses Final diagnoses:  Paresthesia  Nonintractable headache, unspecified chronicity pattern, unspecified headache type    Rx / DC Orders ED Discharge Orders     None         Ritisha Deitrick, Corene Cornea, MD 11/22/21 [phone]

## 2021-11-22 NOTE — Telephone Encounter (Signed)
LMOM for pt to CB in regards to a question from Dr. Olivia Mackie:   McLean-Scocuzza, Nino Glow, MD routed conversation to You 4 hours ago (12:22 PM)   McLean-Scocuzza, Nino Glow, MD 4 hours ago (12:22 PM)   TM Does pt want referral to neurology h/a and ENT sinus disease on CT head?  Sorry she is not better        Note

## 2021-11-22 NOTE — ED Notes (Signed)
Patient transported to CT 

## 2021-11-22 NOTE — ED Notes (Signed)
Pt to XRAY

## 2021-11-22 NOTE — Telephone Encounter (Signed)
Does pt want referral to neurology h/a and ENT sinus disease on CT head?  Sorry she is not better

## 2021-11-22 NOTE — Telephone Encounter (Signed)
LMOM for pt to CB.  

## 2021-11-22 NOTE — ED Triage Notes (Addendum)
Pt reported to ED  with c/o tingling to left arm that began upon awakening this am and constant headache. Pt states she has recently taken steroid injection for persistent sinus infection and headache. No deficits noted at time of triage.

## 2021-11-27 NOTE — Telephone Encounter (Signed)
Left another msg for pt to CB

## 2021-11-27 NOTE — Telephone Encounter (Signed)
Patient returned office phone call and at this time she does not need any referrals, thank you.

## 2021-12-26 ENCOUNTER — Encounter: Payer: Self-pay | Admitting: Internal Medicine

## 2022-01-08 IMAGING — MG MM DIGITAL SCREENING BILAT W/ TOMO AND CAD
8 series · 8 of 24 positions shown · non-contrast
Comparison: Previous exam(s).

CLINICAL DATA: Screening.

EXAM:
DIGITAL SCREENING BILATERAL MAMMOGRAM WITH TOMOSYNTHESIS AND CAD
TECHNIQUE: Bilateral screening digital craniocaudal and mediolateral oblique
mammograms were obtained. Bilateral screening digital breast
tomosynthesis was performed. The images were evaluated with
computer-aided detection.

[R MLO synth-2D]
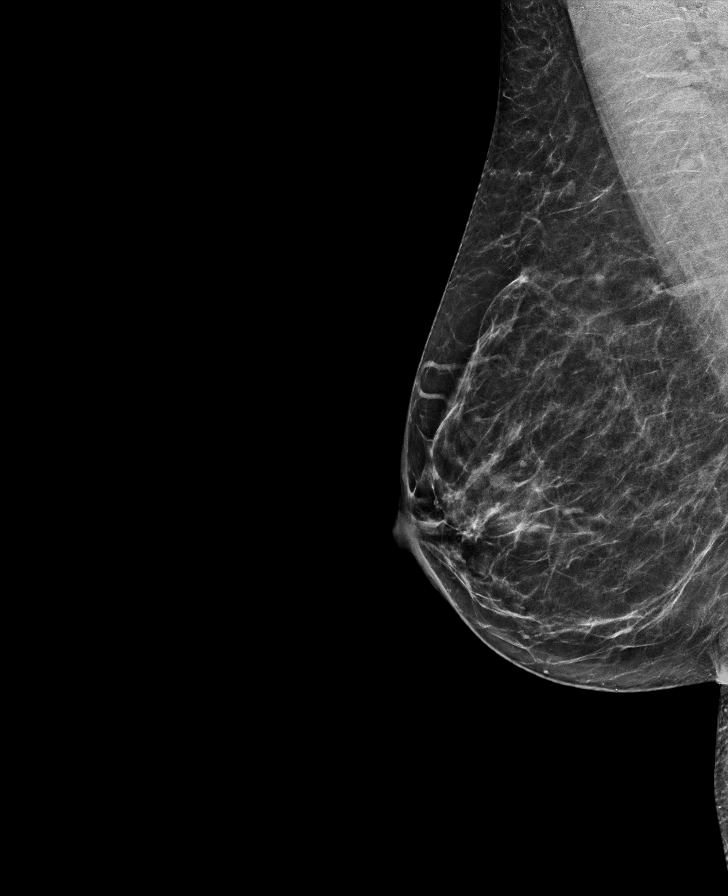

[L MLO synth-2D]
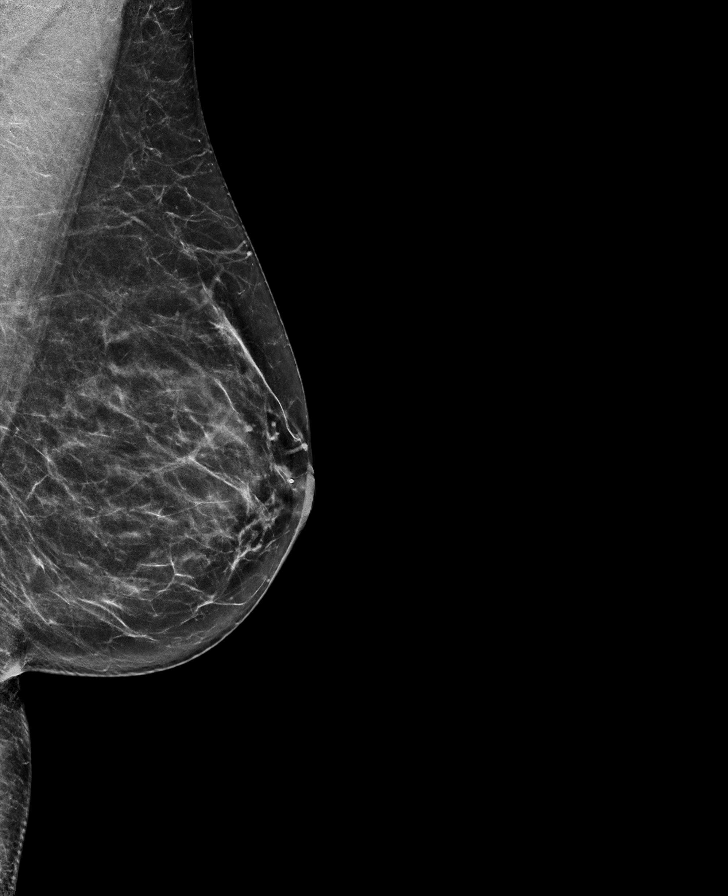

[L CC synth-2D]
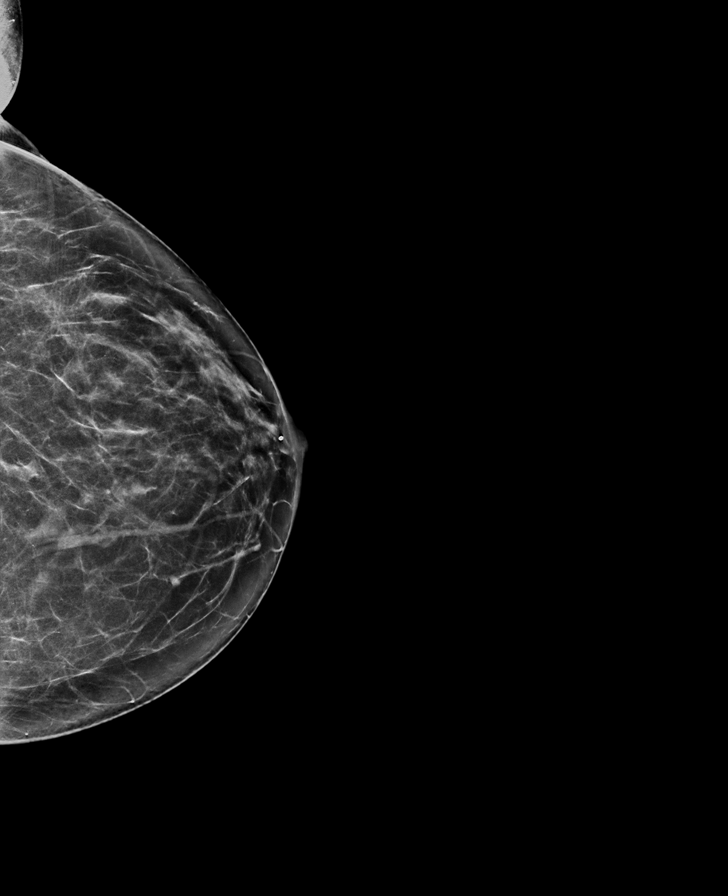

[R CC synth-2D]
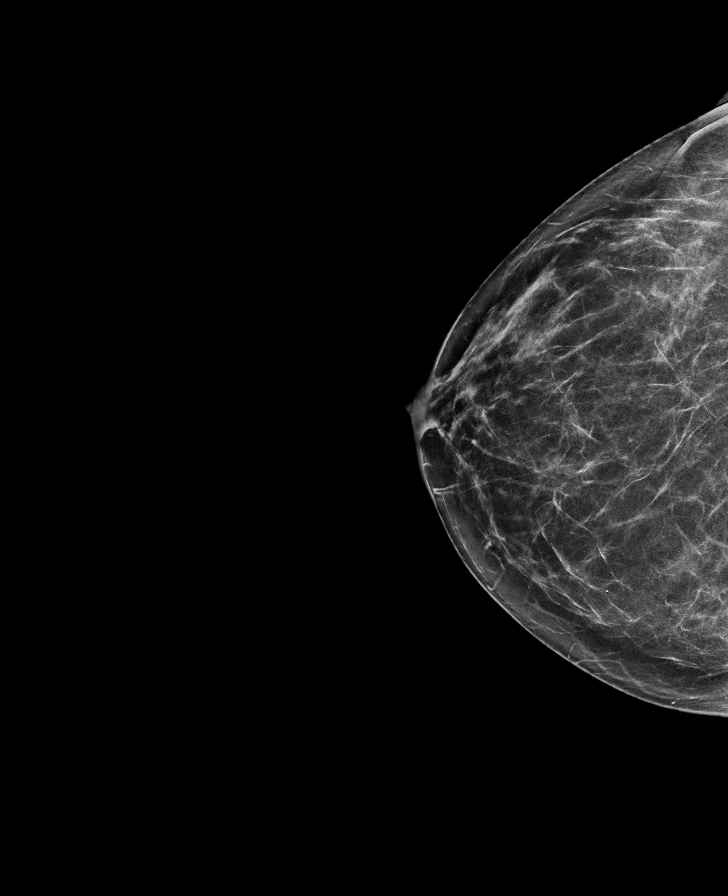

[R CC tomo · tomo slice 35/68.0]
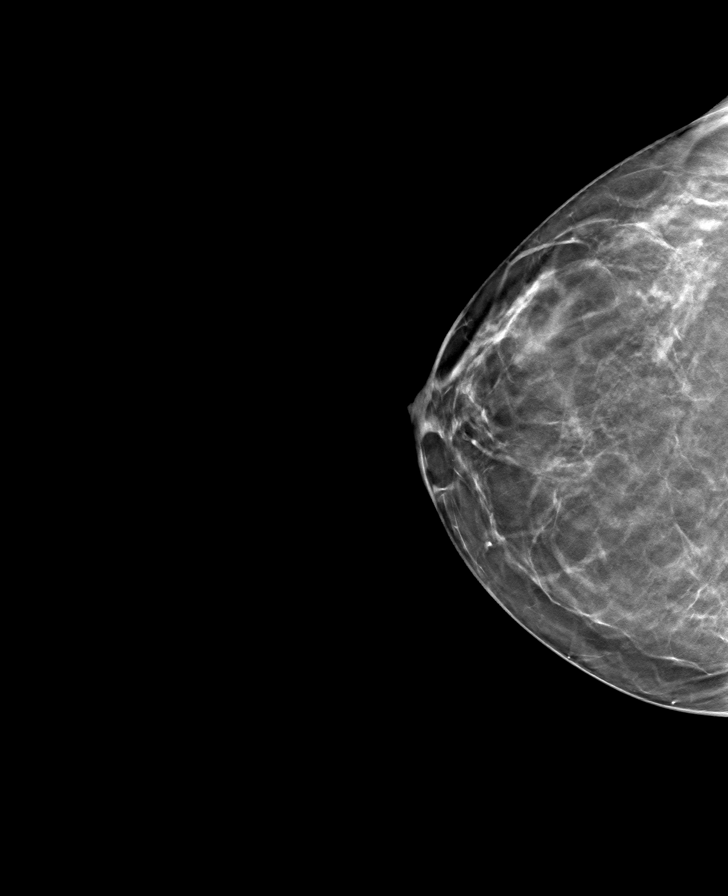

[L MLO tomo · tomo slice 35/68.0]
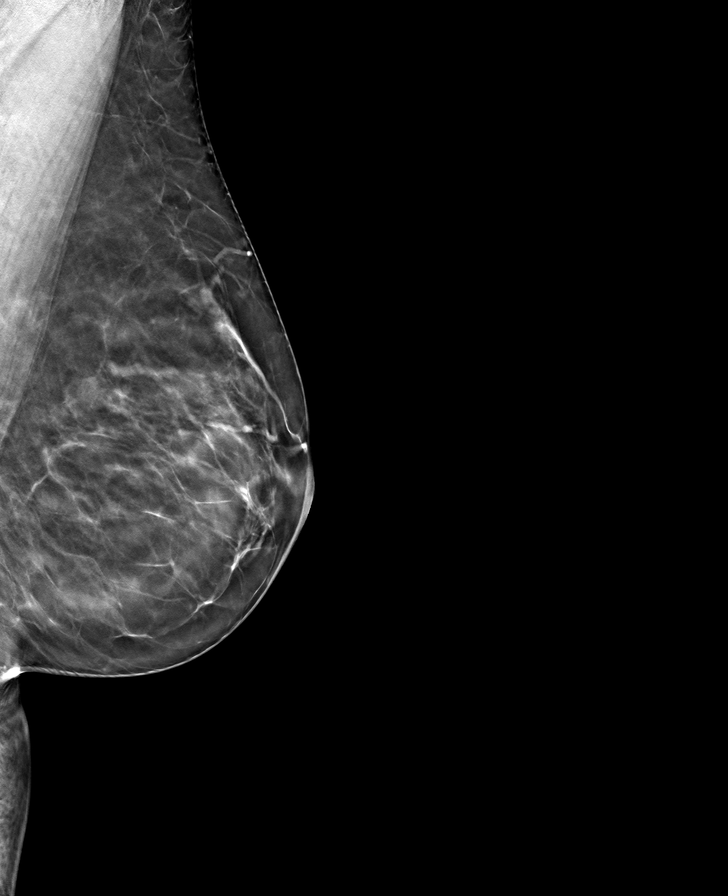

[L CC tomo · tomo slice 35/68.0]
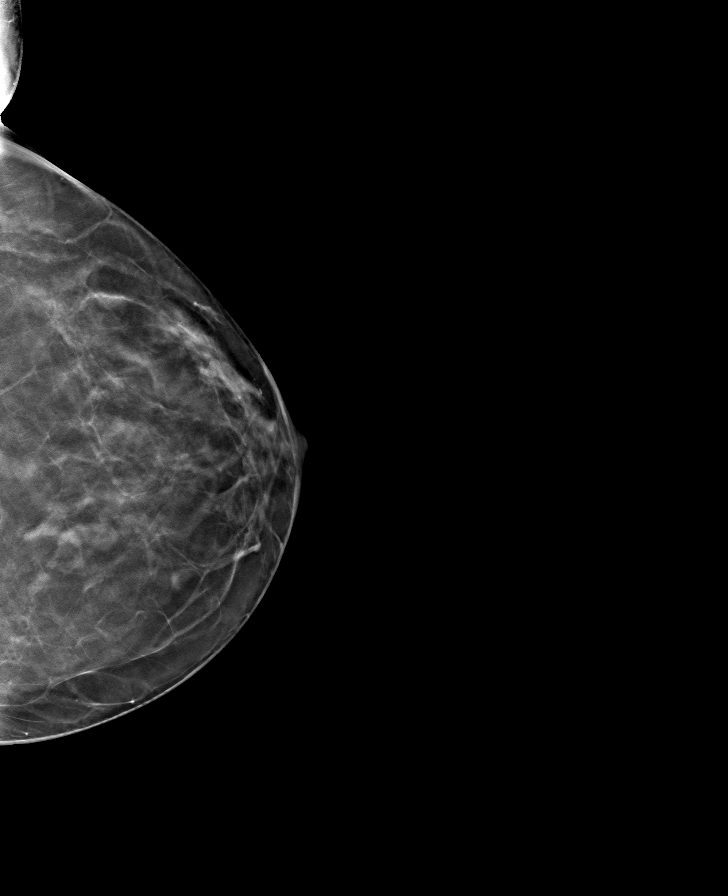

[R MLO tomo · tomo slice 35/69.0]
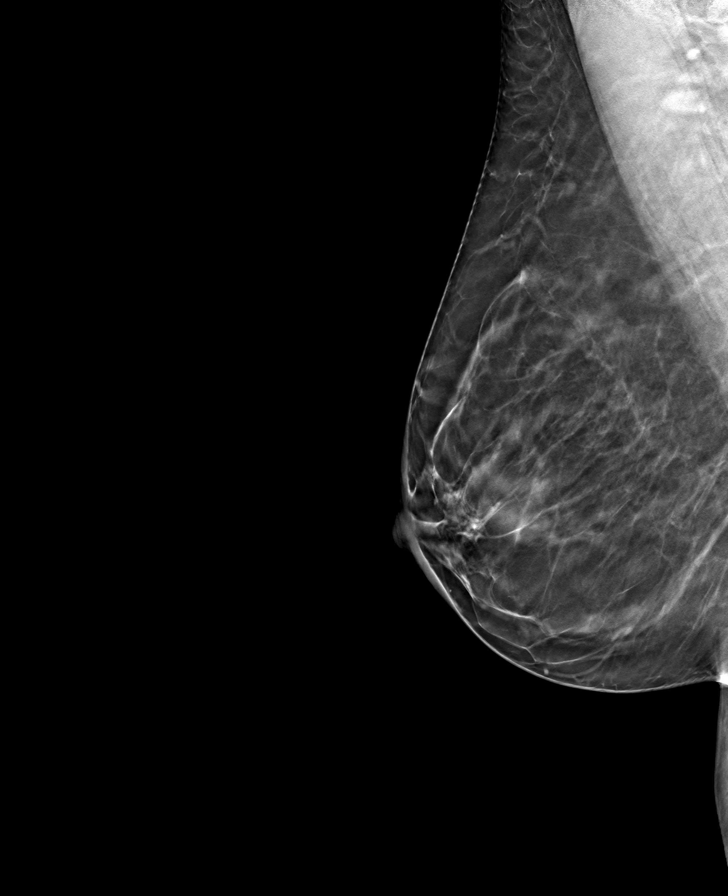

[8 of 24 positions shown; findings below may reference images not displayed]

ACR Breast Density Category b: There are scattered areas of
fibroglandular density.
FINDINGS: There are no findings suspicious for malignancy.
IMPRESSION: No mammographic evidence of malignancy. A result letter of this
screening mammogram will be mailed directly to the patient.

RECOMMENDATION:
Screening mammogram in one year. (Code:51-O-LD2)

BI-RADS CATEGORY  1: Negative.

## 2022-01-14 DIAGNOSIS — M24812 Other specific joint derangements of left shoulder, not elsewhere classified: Secondary | ICD-10-CM | POA: Diagnosis not present

## 2022-01-14 DIAGNOSIS — M25512 Pain in left shoulder: Secondary | ICD-10-CM | POA: Diagnosis not present

## 2022-01-22 ENCOUNTER — Encounter: Payer: Managed Care, Other (non HMO) | Admitting: Family Medicine

## 2022-01-28 ENCOUNTER — Ambulatory Visit: Payer: BC Managed Care – PPO | Admitting: Family

## 2022-01-28 ENCOUNTER — Encounter: Payer: Self-pay | Admitting: Family

## 2022-01-28 VITALS — BP 118/80 | HR 76 | Temp 97.9°F | Ht 62.0 in | Wt 136.2 lb

## 2022-01-28 DIAGNOSIS — R350 Frequency of micturition: Secondary | ICD-10-CM | POA: Diagnosis not present

## 2022-01-28 LAB — POCT URINALYSIS DIPSTICK
Bilirubin, UA: NEGATIVE
Glucose, UA: NEGATIVE
Ketones, UA: NEGATIVE
Nitrite, UA: NEGATIVE
Protein, UA: NEGATIVE
Spec Grav, UA: 1.015 (ref 1.010–1.025)
Urobilinogen, UA: 0.2 E.U./dL
pH, UA: 7 (ref 5.0–8.0)

## 2022-01-28 LAB — URINALYSIS, ROUTINE W REFLEX MICROSCOPIC
Bilirubin Urine: NEGATIVE
Ketones, ur: NEGATIVE
Nitrite: NEGATIVE
Specific Gravity, Urine: <=1.005 — AB (ref 1.000–1.030)
Total Protein, Urine: NEGATIVE
Urine Glucose: NEGATIVE
Urobilinogen, UA: 0.2 (ref 0.0–1.0)
pH: 7 (ref 5.0–8.0)

## 2022-01-28 MED ORDER — AMOXICILLIN-POT CLAVULANATE 875-125 MG PO TABS: 1.0000 | ORAL_TABLET | Freq: Two times a day (BID) | ORAL | 0 refills | Status: DC

## 2022-01-28 NOTE — Progress Notes (Signed)
Subjective:    Patient ID: Deanna Young, female    DOB: [birth year], 56 y.o.   MRN: [id number]  CC: Deanna Young is a 56 y.o. female who presents today for an acute visit.    HPI: Complains of urinary frequency x 2 weeks.  When she urinates,it will be a small amount. Episodic dysuria.   No fever, chills, n, urinary incontinence.      She was on Augmentin 2 months ago for sinusitis  No h/o ckd  History of abdominal hysterectomy, UTI, cholecystectomy  HISTORY:  Past Medical History:  Diagnosis Date   Allergies    COVID-19    summer 2020, xmas 2021   Decreased sex drive    Depression    Diverticulosis 2019   GERD (gastroesophageal reflux disease)    HLD (hyperlipidemia)    Hx of adenomatous colonic polyps 11/2017   8 mm sessile serrated/adenoma in descending colon   Hypercholesteremia    Osteopenia    Premature ovarian failure    UTI (urinary tract infection)    Past Surgical History:  Procedure Laterality Date   ABDOMINAL HYSTERECTOMY     lsh   CHOLECYSTECTOMY     TONSILLECTOMY AND ADENOIDECTOMY     Family History  Problem Relation Age of Onset   Osteoporosis Mother    Diabetes Mother    Depression Mother    Diabetes gravidarum Mother    Heart attack Mother    Hypertension Mother    Hyperlipidemia Mother    Mental illness Mother    Drug abuse Father    Hearing loss Father    Hyperlipidemia Father    Alcohol abuse Brother    Drug abuse Brother    Diverticulitis Brother    Diverticulitis Maternal Aunt    Heart disease Maternal Grandfather    Cancer Neg Hx    Ovarian cancer Neg Hx    Colon polyps Neg Hx    Breast cancer Neg Hx    Colon cancer Neg Hx    Stomach cancer Neg Hx    Rectal cancer Neg Hx    Esophageal cancer Neg Hx     Allergies: Gramineae pollens Current Outpatient Medications on File Prior to Visit  Medication Sig Dispense Refill   calcium-vitamin D 250-100 MG-UNIT tablet Take 1 tablet by mouth 2 (two) times daily.      dicyclomine (BENTYL) 10 MG capsule Take 1-2 capsules (10-20 mg total) by mouth every 8 (eight) hours as needed for spasms. 60 capsule 1   estradiol (VIVELLE-DOT) 0.1 MG/24HR patch Place 1 patch (0.1 mg total) onto the skin 2 (two) times a week. 24 patch 3   fluticasone (FLONASE) 50 MCG/ACT nasal spray Place 2 sprays into both nostrils daily. 16 g 0   rizatriptan (MAXALT) 10 MG tablet Take 1 tablet (10 mg total) by mouth as needed for migraine. May repeat in 2 hours if needed 10 tablet 0   No current facility-administered medications on file prior to visit.    Social History   Tobacco Use   Smoking status: Never   Smokeless tobacco: Never  Vaping Use   Vaping Use: Never used  Substance Use Topics   Alcohol use: Yes    Comment: social   Drug use: No    Review of Systems  Constitutional:  Negative for chills and fever.  Respiratory:  Negative for cough.   Cardiovascular:  Negative for chest pain and palpitations.  Gastrointestinal:  Negative for nausea and vomiting.  Genitourinary:  Positive for frequency.      Objective:    There were no vitals taken for this visit.   Physical Exam Vitals reviewed.  Constitutional:      Appearance: She is well-developed.  Cardiovascular:     Rate and Rhythm: Normal rate and regular rhythm.     Pulses: Normal pulses.     Heart sounds: Normal heart sounds.  Pulmonary:     Effort: Pulmonary effort is normal.     Breath sounds: Normal breath sounds. No wheezing, rhonchi or rales.  Skin:    General: Skin is warm and dry.  Neurological:     Mental Status: She is alert.  Psychiatric:        Speech: Speech normal.        Behavior: Behavior normal.        Thought Content: Thought content normal.        Assessment & Plan:   Problem List Items Addressed This Visit       Other   Urinary frequency - Primary    Afebrile and nontoxic in appearance.  Duration 2 weeks.  Point-of-care urine positive for trace blood, leukocytes.  Negative  nitrites.  Patient and I discussed symptoms in regards to urine findings we opted to start empiric therapy with Augmentin.  She will start probiotics as well.  Pending urine culture      Relevant Medications   amoxicillin-clavulanate (AUGMENTIN) 875-125 MG tablet   Other Relevant Orders   Urinalysis, Routine w reflex microscopic   Urine Culture   POCT Urinalysis Dipstick (Completed)      I have discontinued Porschea Borys. Roane's amoxicillin-clavulanate. I am also having her start on amoxicillin-clavulanate. Additionally, I am having her maintain her calcium-vitamin D, estradiol, dicyclomine, rizatriptan, and fluticasone.   Meds ordered this encounter  Medications   amoxicillin-clavulanate (AUGMENTIN) 875-125 MG tablet    Sig: Take 1 tablet by mouth 2 (two) times daily for 7 days.    Dispense:  14 tablet    Refill:  0    Order Specific Question:   Supervising Provider    Answer:   Crecencio Mc [[id number]]    Return precautions given.   Risks, benefits, and alternatives of the medications and treatment plan prescribed today were discussed, and patient expressed understanding.   Education regarding symptom management and diagnosis given to patient on AVS.  Continue to follow with McLean-Scocuzza, Nino Glow, MD for routine health maintenance.   Pricilla Handler and I agreed with plan.   Mable Paris, FNP

## 2022-01-28 NOTE — Assessment & Plan Note (Addendum)
Afebrile and nontoxic in appearance.  Duration 2 weeks.  Point-of-care urine positive for trace blood, leukocytes.  Negative nitrites.  Patient and I discussed symptoms in regards to urine findings we opted to start empiric therapy with Augmentin.  She will start probiotics as well.  Pending urine culture

## 2022-01-28 NOTE — Patient Instructions (Signed)
As discussed, we will start empiric antibiotic therapy ahead of urine culture.  Please start Augmentin, antibiotic. Ensure to take probiotics while on antibiotics and also for 2 weeks after completion. This can either be by eating yogurt daily or taking a probiotic supplement over the counter such as Culturelle.It is important to re-colonize the gut with good bacteria and also to prevent any diarrheal infections associated with antibiotic use.   Please let me know how you are doing  Urinary Tract Infection, Adult  A urinary tract infection (UTI) is an infection of any part of the urinary tract. The urinary tract includes the kidneys, ureters, bladder, and urethra. These organs make, store, and get rid of urine in the body. An upper UTI affects the ureters and kidneys. A lower UTI affects the bladder and urethra. What are the causes? Most urinary tract infections are caused by bacteria in your genital area around your urethra, where urine leaves your body. These bacteria grow and cause inflammation of your urinary tract. What increases the risk? You are more likely to develop this condition if: You have a urinary catheter that stays in place. You are not able to control when you urinate or have a bowel movement (incontinence). You are female and you: Use a spermicide or diaphragm for birth control. Have low estrogen levels. Are pregnant. You have certain genes that increase your risk. You are sexually active. You take antibiotic medicines. You have a condition that causes your flow of urine to slow down, such as: An enlarged prostate, if you are female. Blockage in your urethra. A kidney stone. A nerve condition that affects your bladder control (neurogenic bladder). Not getting enough to drink, or not urinating often. You have certain medical conditions, such as: Diabetes. A weak disease-fighting system (immunesystem). Sickle cell disease. Gout. Spinal cord injury. What are the signs  or symptoms? Symptoms of this condition include: Needing to urinate right away (urgency). Frequent urination. This may include small amounts of urine each time you urinate. Pain or burning with urination. Blood in the urine. Urine that smells bad or unusual. Trouble urinating. Cloudy urine. Vaginal discharge, if you are female. Pain in the abdomen or the lower back. You may also have: Vomiting or a decreased appetite. Confusion. Irritability or tiredness. A fever or chills. Diarrhea. The first symptom in older adults may be confusion. In some cases, they may not have any symptoms until the infection has worsened. How is this diagnosed? This condition is diagnosed based on your medical history and a physical exam. You may also have other tests, including: Urine tests. Blood tests. Tests for STIs (sexually transmitted infections). If you have had more than one UTI, a cystoscopy or imaging studies may be done to determine the cause of the infections. How is this treated? Treatment for this condition includes: Antibiotic medicine. Over-the-counter medicines to treat discomfort. Drinking enough water to stay hydrated. If you have frequent infections or have other conditions such as a kidney stone, you may need to see a health care provider who specializes in the urinary tract (urologist). In rare cases, urinary tract infections can cause sepsis. Sepsis is a life-threatening condition that occurs when the body responds to an infection. Sepsis is treated in the hospital with IV antibiotics, fluids, and other medicines. Follow these instructions at home:  Medicines Take over-the-counter and prescription medicines only as told by your health care provider. If you were prescribed an antibiotic medicine, take it as told by your health care provider. Do  not stop using the antibiotic even if you start to feel better. General instructions Make sure you: Empty your bladder often and  completely. Do not hold urine for long periods of time. Empty your bladder after sex. Wipe from front to back after urinating or having a bowel movement if you are female. Use each tissue only one time when you wipe. Drink enough fluid to keep your urine pale yellow. Keep all follow-up visits. This is important. Contact a health care provider if: Your symptoms do not get better after 1-2 days. Your symptoms go away and then return. Get help right away if: You have severe pain in your back or your lower abdomen. You have a fever or chills. You have nausea or vomiting. Summary A urinary tract infection (UTI) is an infection of any part of the urinary tract, which includes the kidneys, ureters, bladder, and urethra. Most urinary tract infections are caused by bacteria in your genital area. Treatment for this condition often includes antibiotic medicines. If you were prescribed an antibiotic medicine, take it as told by your health care provider. Do not stop using the antibiotic even if you start to feel better. Keep all follow-up visits. This is important. This information is not intended to replace advice given to you by your health care provider. Make sure you discuss any questions you have with your health care provider. Document Revised: 09/30/2019 Document Reviewed: 09/30/2019 Elsevier Patient Education  [place].

## 2022-01-29 DIAGNOSIS — M7502 Adhesive capsulitis of left shoulder: Secondary | ICD-10-CM | POA: Diagnosis not present

## 2022-01-29 DIAGNOSIS — M542 Cervicalgia: Secondary | ICD-10-CM | POA: Diagnosis not present

## 2022-01-31 ENCOUNTER — Encounter: Payer: Self-pay | Admitting: Family

## 2022-01-31 ENCOUNTER — Other Ambulatory Visit: Payer: Self-pay | Admitting: Family

## 2022-01-31 DIAGNOSIS — R350 Frequency of micturition: Secondary | ICD-10-CM

## 2022-01-31 LAB — URINE CULTURE
MICRO NUMBER:: 14239937
SPECIMEN QUALITY:: ADEQUATE

## 2022-01-31 MED ORDER — NITROFURANTOIN MONOHYD MACRO 100 MG PO CAPS: 100.0000 mg | ORAL_CAPSULE | Freq: Two times a day (BID) | ORAL | 0 refills | Status: DC

## 2022-02-06 DIAGNOSIS — M25512 Pain in left shoulder: Secondary | ICD-10-CM | POA: Diagnosis not present

## 2022-02-12 DIAGNOSIS — M7502 Adhesive capsulitis of left shoulder: Secondary | ICD-10-CM | POA: Diagnosis not present

## 2022-02-14 ENCOUNTER — Encounter: Payer: Self-pay | Admitting: Family Medicine

## 2022-02-25 DIAGNOSIS — N950 Postmenopausal bleeding: Secondary | ICD-10-CM | POA: Diagnosis not present

## 2022-02-25 DIAGNOSIS — Z1331 Encounter for screening for depression: Secondary | ICD-10-CM | POA: Diagnosis not present

## 2022-02-25 DIAGNOSIS — Z01419 Encounter for gynecological examination (general) (routine) without abnormal findings: Secondary | ICD-10-CM | POA: Diagnosis not present

## 2022-03-10 DIAGNOSIS — M7502 Adhesive capsulitis of left shoulder: Secondary | ICD-10-CM | POA: Diagnosis not present

## 2022-03-10 DIAGNOSIS — M25612 Stiffness of left shoulder, not elsewhere classified: Secondary | ICD-10-CM | POA: Diagnosis not present

## 2022-03-13 DIAGNOSIS — M7502 Adhesive capsulitis of left shoulder: Secondary | ICD-10-CM | POA: Diagnosis not present

## 2022-03-13 DIAGNOSIS — M25612 Stiffness of left shoulder, not elsewhere classified: Secondary | ICD-10-CM | POA: Diagnosis not present

## 2022-03-18 DIAGNOSIS — M7502 Adhesive capsulitis of left shoulder: Secondary | ICD-10-CM | POA: Diagnosis not present

## 2022-03-18 DIAGNOSIS — M25612 Stiffness of left shoulder, not elsewhere classified: Secondary | ICD-10-CM | POA: Diagnosis not present

## 2022-03-19 DIAGNOSIS — M7502 Adhesive capsulitis of left shoulder: Secondary | ICD-10-CM | POA: Diagnosis not present

## 2022-03-19 DIAGNOSIS — M25612 Stiffness of left shoulder, not elsewhere classified: Secondary | ICD-10-CM | POA: Diagnosis not present

## 2022-03-25 DIAGNOSIS — M25612 Stiffness of left shoulder, not elsewhere classified: Secondary | ICD-10-CM | POA: Diagnosis not present

## 2022-03-25 DIAGNOSIS — M7502 Adhesive capsulitis of left shoulder: Secondary | ICD-10-CM | POA: Diagnosis not present

## 2022-03-26 DIAGNOSIS — N950 Postmenopausal bleeding: Secondary | ICD-10-CM | POA: Diagnosis not present

## 2022-04-01 DIAGNOSIS — M7502 Adhesive capsulitis of left shoulder: Secondary | ICD-10-CM | POA: Diagnosis not present

## 2022-04-01 DIAGNOSIS — M25612 Stiffness of left shoulder, not elsewhere classified: Secondary | ICD-10-CM | POA: Diagnosis not present

## 2022-04-04 DIAGNOSIS — M7502 Adhesive capsulitis of left shoulder: Secondary | ICD-10-CM | POA: Diagnosis not present

## 2022-04-10 DIAGNOSIS — M25612 Stiffness of left shoulder, not elsewhere classified: Secondary | ICD-10-CM | POA: Diagnosis not present

## 2022-04-10 DIAGNOSIS — M7502 Adhesive capsulitis of left shoulder: Secondary | ICD-10-CM | POA: Diagnosis not present

## 2022-04-14 ENCOUNTER — Other Ambulatory Visit: Payer: Self-pay | Admitting: Gastroenterology

## 2022-04-18 ENCOUNTER — Encounter: Payer: Self-pay | Admitting: Family

## 2022-05-15 ENCOUNTER — Encounter: Payer: Managed Care, Other (non HMO) | Admitting: Family Medicine

## 2022-06-04 ENCOUNTER — Encounter: Payer: Managed Care, Other (non HMO) | Admitting: Family

## 2022-06-10 ENCOUNTER — Ambulatory Visit: Payer: BC Managed Care – PPO | Admitting: Nurse Practitioner

## 2022-06-10 ENCOUNTER — Encounter: Payer: Self-pay | Admitting: Nurse Practitioner

## 2022-06-10 VITALS — BP 128/80 | HR 77 | Temp 97.4°F | Ht 62.0 in | Wt 146.0 lb

## 2022-06-10 DIAGNOSIS — B029 Zoster without complications: Secondary | ICD-10-CM

## 2022-06-10 MED ORDER — VALACYCLOVIR HCL 1 G PO TABS: 1000.0000 mg | ORAL_TABLET | Freq: Two times a day (BID) | ORAL | 0 refills | Status: AC

## 2022-06-10 NOTE — Progress Notes (Signed)
Established Patient Office Visit  Subjective:  Patient ID: Deanna Young, female    DOB: 07-02-1965  Age: 57 y.o. MRN: [id number]  CC:  Chief Complaint  Patient presents with   possible shingles    HPI  Deanna Young presents for rash   Rash This is a new problem. The current episode started in the past 7 days. The problem has been gradually worsening since onset. The affected locations include the torso. The rash is characterized by burning, itchiness and redness. She was exposed to nothing. Pertinent negatives include no cough, fatigue, fever or sore throat. Past treatments include topical steroids. The treatment provided mild relief. There is no history of asthma.     Past Medical History:  Diagnosis Date   Allergies    COVID-19    summer 2020, xmas 2021   Decreased sex drive    Depression    Diverticulosis 2019   GERD (gastroesophageal reflux disease)    HLD (hyperlipidemia)    Hx of adenomatous colonic polyps 11/2017   8 mm sessile serrated/adenoma in descending colon   Hypercholesteremia    Osteopenia    Premature ovarian failure    UTI (urinary tract infection)     Past Surgical History:  Procedure Laterality Date   ABDOMINAL HYSTERECTOMY     lsh   CHOLECYSTECTOMY     TONSILLECTOMY AND ADENOIDECTOMY      Family History  Problem Relation Age of Onset   Osteoporosis Mother    Diabetes Mother    Depression Mother    Diabetes gravidarum Mother    Heart attack Mother    Hypertension Mother    Hyperlipidemia Mother    Mental illness Mother    Drug abuse Father    Hearing loss Father    Hyperlipidemia Father    Alcohol abuse Brother    Drug abuse Brother    Diverticulitis Brother    Diverticulitis Maternal Aunt    Heart disease Maternal Grandfather    Cancer Neg Hx    Ovarian cancer Neg Hx    Colon polyps Neg Hx    Breast cancer Neg Hx    Colon cancer Neg Hx    Stomach cancer Neg Hx    Rectal cancer Neg Hx    Esophageal cancer Neg Hx      Social History   Socioeconomic History   Marital status: Married    Spouse name: Not on file   Number of children: 2   Years of education: Not on file   Highest education level: Not on file  Occupational History   Not on file  Tobacco Use   Smoking status: Never   Smokeless tobacco: Never  Vaping Use   Vaping Use: Never used  Substance and Sexual Activity   Alcohol use: Yes    Comment: social   Drug use: No   Sexual activity: Yes    Birth control/protection: Surgical  Other Topics Concern   Not on file  Social History Narrative   Tennis, golf pickle ball    Married    Social Determinants of Health   Financial Resource Strain: Not on file  Food Insecurity: Not on file  Transportation Needs: Not on file  Physical Activity: Sufficiently Active (08/19/2017)   Exercise Vital Sign    Days of Exercise per Week: 5 days    Minutes of Exercise per Session: 60 min  Stress: Not on file  Social Connections: Not on file  Intimate Partner Violence: Not on file  Outpatient Medications Prior to Visit  Medication Sig Dispense Refill   calcium-vitamin D 250-100 MG-UNIT tablet Take 1 tablet by mouth 2 (two) times daily.     estradiol (VIVELLE-DOT) 0.1 MG/24HR patch Place 1 patch (0.1 mg total) onto the skin 2 (two) times a week. 24 patch 3   nitrofurantoin, macrocrystal-monohydrate, (MACROBID) 100 MG capsule Take 1 capsule (100 mg total) by mouth 2 (two) times daily. Take with food. 10 capsule 0   dicyclomine (BENTYL) 10 MG capsule Take 1 capsule (10 mg total) by mouth every 8 (eight) hours as needed for spasms. Please schedule a yearly follow up for further refills. Thank you (Patient not taking: Reported on 06/10/2022) 60 capsule 0   fluticasone (FLONASE) 50 MCG/ACT nasal spray Place 2 sprays into both nostrils daily. (Patient not taking: Reported on 01/28/2022) 16 g 0   rizatriptan (MAXALT) 10 MG tablet Take 1 tablet (10 mg total) by mouth as needed for migraine. May repeat in 2  hours if needed (Patient not taking: Reported on 01/28/2022) 10 tablet 0   No facility-administered medications prior to visit.    Allergies  Allergen Reactions   Gramineae Pollens Other (See Comments)    ROS Review of Systems  Constitutional:  Negative for fatigue and fever.  HENT:  Negative for sore throat.   Respiratory:  Negative for cough.   Skin:  Positive for rash.      Objective:    Physical Exam Abdominal:       Comments: Erythema on the abdomen and back     BP 128/80   Pulse 77   Temp (!) 97.4 F (36.3 C) (Oral)   Ht 5\' 2"  (1.575 m)   Wt 146 lb (66.2 kg)   SpO2 99%   BMI 26.70 kg/m  Wt Readings from Last 3 Encounters:  06/10/22 146 lb (66.2 kg)  01/28/22 136 lb 3.2 oz (61.8 kg)  11/20/21 138 lb (62.6 kg)     Health Maintenance  Topic Date Due   Zoster Vaccines- Shingrix (1 of 2) Never done   COVID-19 Vaccine (5 - 2023-24 season) 11/01/2021   Hepatitis C Screening  09/20/2022 (Originally 10/21/1985)   HIV Screening  09/20/2022 (Originally 10/22/1982)   INFLUENZA VACCINE  10/02/2022   MAMMOGRAM  10/29/2023   COLONOSCOPY (Pts 45-67yrs Insurance coverage will need to be confirmed)  11/13/2025   DTaP/Tdap/Td (2 - Td or Tdap) 12/02/2028   HPV VACCINES  Aged Out   PAP SMEAR-Modifier  Discontinued    There are no preventive care reminders to display for this patient.  Lab Results  Component Value Date   TSH 2.57 09/20/2021   Lab Results  Component Value Date   WBC 7.2 11/22/2021   HGB 14.3 11/22/2021   HCT 42.9 11/22/2021   MCV 93.1 11/22/2021   PLT 296 11/22/2021   Lab Results  Component Value Date   NA 138 11/22/2021   K 4.0 11/22/2021   CO2 26 11/22/2021   GLUCOSE 97 11/22/2021   BUN 10 11/22/2021   CREATININE 0.76 11/22/2021   BILITOT 0.7 11/22/2021   ALKPHOS 45 11/22/2021   AST 22 11/22/2021   ALT 11 11/22/2021   PROT 7.3 11/22/2021   ALBUMIN 4.1 11/22/2021   CALCIUM 9.4 11/22/2021   ANIONGAP 8 11/22/2021   GFR 100.17  09/20/2021   Lab Results  Component Value Date   CHOL 193 09/20/2021   Lab Results  Component Value Date   HDL 62.50 09/20/2021   Lab Results  Component Value Date   LDLCALC 116 (H) 09/20/2021   Lab Results  Component Value Date   TRIG 74.0 09/20/2021   Lab Results  Component Value Date   CHOLHDL 3 09/20/2021   Lab Results  Component Value Date   HGBA1C 5.0 08/19/2017      Assessment & Plan:  Herpes zoster without complication Assessment & Plan: Started on Valtrex. Advised patient to continue use OTC steroid cream. Take over-the-counter NSAIDs as needed for pain. If symptoms not improving call the office back for evaluation   Other orders -     valACYclovir HCl; Take 1 tablet (1,000 mg total) by mouth 2 (two) times daily for 7 days.  Dispense: 14 tablet; Refill: 0    Follow-up: Return if symptoms worsen or fail to improve.   Kara Diesharanpreet  Shaniya Tashiro, NP

## 2022-06-10 NOTE — Patient Instructions (Addendum)
Valtrex sent to pharmacy. Continue topical steroid and use OTC NSAIDs for pain relief. Call the office if Symptoms not improving.

## 2022-06-16 ENCOUNTER — Encounter: Payer: Self-pay | Admitting: Nurse Practitioner

## 2022-06-16 DIAGNOSIS — B029 Zoster without complications: Secondary | ICD-10-CM | POA: Insufficient documentation

## 2022-06-16 NOTE — Assessment & Plan Note (Signed)
Started on Valtrex. Advised patient to continue use OTC steroid cream. Take over-the-counter NSAIDs as needed for pain. If symptoms not improving call the office back for evaluation

## 2022-09-15 ENCOUNTER — Encounter: Payer: Self-pay | Admitting: Obstetrics and Gynecology

## 2022-09-17 DIAGNOSIS — D2261 Melanocytic nevi of right upper limb, including shoulder: Secondary | ICD-10-CM | POA: Diagnosis not present

## 2022-09-17 DIAGNOSIS — D225 Melanocytic nevi of trunk: Secondary | ICD-10-CM | POA: Diagnosis not present

## 2022-09-17 DIAGNOSIS — D2262 Melanocytic nevi of left upper limb, including shoulder: Secondary | ICD-10-CM | POA: Diagnosis not present

## 2022-09-17 DIAGNOSIS — D2272 Melanocytic nevi of left lower limb, including hip: Secondary | ICD-10-CM | POA: Diagnosis not present

## 2022-09-24 ENCOUNTER — Encounter: Payer: Self-pay | Admitting: Family

## 2022-09-24 ENCOUNTER — Encounter: Payer: Managed Care, Other (non HMO) | Admitting: Family Medicine

## 2022-09-24 ENCOUNTER — Telehealth: Payer: Self-pay | Admitting: Family

## 2022-09-24 ENCOUNTER — Ambulatory Visit (INDEPENDENT_AMBULATORY_CARE_PROVIDER_SITE_OTHER): Payer: BC Managed Care – PPO | Admitting: Family

## 2022-09-24 VITALS — BP 124/76 | HR 62 | Temp 97.7°F | Ht 62.0 in | Wt 143.0 lb

## 2022-09-24 DIAGNOSIS — Z0000 Encounter for general adult medical examination without abnormal findings: Secondary | ICD-10-CM

## 2022-09-24 DIAGNOSIS — R011 Cardiac murmur, unspecified: Secondary | ICD-10-CM | POA: Diagnosis not present

## 2022-09-24 DIAGNOSIS — Z8249 Family history of ischemic heart disease and other diseases of the circulatory system: Secondary | ICD-10-CM | POA: Insufficient documentation

## 2022-09-24 DIAGNOSIS — Z136 Encounter for screening for cardiovascular disorders: Secondary | ICD-10-CM

## 2022-09-24 DIAGNOSIS — Z1231 Encounter for screening mammogram for malignant neoplasm of breast: Secondary | ICD-10-CM | POA: Diagnosis not present

## 2022-09-24 DIAGNOSIS — Z13220 Encounter for screening for lipoid disorders: Secondary | ICD-10-CM

## 2022-09-24 DIAGNOSIS — Z780 Asymptomatic menopausal state: Secondary | ICD-10-CM

## 2022-09-24 NOTE — Telephone Encounter (Signed)
Lft pt vm to call ofc to sch CT.

## 2022-09-24 NOTE — Patient Instructions (Addendum)
Please call Deanna Young and schedule your mammogram  I have ordered an echocardiogram to evaluate for heart murmur.  We will call you to schedule this.  Please let me know if you have not received a call in 1 week's time.   I have ordered CT calcium score ( SELF PAY option)  to further stratify your overall cardiovascular risk .  You will schedule this test on your own.  An estimate of cost is $150-200 out-of-pocket as not covered by insurance. However please call your insurance company in advance to ensure no other costs so that you do not have any unexpected bills.    I have placed your order to [address] in [place]  Phone Number to [place] on [address] is is [phone] to get scheduled.   Please call to get scheduled and if any issues at all in doing so, please let me know.   Below an article from [facility] regarding the test.   [email or web address]/exams-[email or web address]-[email or web address]-ct#:~:text=A%[email or web address]%20CT%20calcium%20score,arteries%20can%20cause%20heart%20attacks. Health Maintenance for Postmenopausal Women Menopause is a normal process in which your ability to get pregnant comes to an end. This process happens slowly over many months or years, usually between the ages of 22 and 41. Menopause is complete when you have missed your menstrual period for 12 months. It is important to talk with your health care provider about some of the most common conditions that affect women after menopause (postmenopausal women). These include heart disease, cancer, and bone loss (osteoporosis). Adopting a healthy lifestyle and getting preventive care can help to promote your health and wellness. The actions you take can also lower your chances of developing some of these common conditions. What are the signs and symptoms of menopause? During menopause, you may have the following symptoms: Hot flashes. These can be moderate or severe. Night  sweats. Decrease in sex drive. Mood swings. Headaches. Tiredness (fatigue). Irritability. Memory problems. Problems falling asleep or staying asleep. Talk with your health care provider about treatment options for your symptoms. Do I need hormone replacement therapy? Hormone replacement therapy is effective in treating symptoms that are caused by menopause, such as hot flashes and night sweats. Hormone replacement carries certain risks, especially as you become older. If you are thinking about using estrogen or estrogen with progestin, discuss the benefits and risks with your health care provider. How can I reduce my risk for heart disease and stroke? The risk of heart disease, heart attack, and stroke increases as you age. One of the causes may be a change in the body's hormones during menopause. This can affect how your body uses dietary fats, triglycerides, and cholesterol. Heart attack and stroke are medical emergencies. There are many things that you can do to help prevent heart disease and stroke. Watch your blood pressure High blood pressure causes heart disease and increases the risk of stroke. This is more likely to develop in people who have high blood pressure readings or are overweight. Have your blood pressure checked: Every 3-5 years if you are 50-79 years of age. Every year if you are 53 years old or older. Eat a healthy diet  Eat a diet that includes plenty of vegetables, fruits, low-fat dairy products, and lean protein. Do not eat a lot of foods that are high in solid fats, added sugars, or sodium. Get regular exercise Get regular exercise. This is one of the most important things you can do for your health. Most adults should: Try to exercise for at least 150 minutes  each week. The exercise should increase your heart rate and make you sweat (moderate-intensity exercise). Try to do strengthening exercises at least twice each week. Do these in addition to the  moderate-intensity exercise. Spend less time sitting. Even light physical activity can be beneficial. Other tips Work with your health care provider to achieve or maintain a healthy weight. Do not use any products that contain nicotine or tobacco. These products include cigarettes, chewing tobacco, and vaping devices, such as e-cigarettes. If you need help quitting, ask your health care provider. Know your numbers. Ask your health care provider to check your cholesterol and your blood sugar (glucose). Continue to have your blood tested as directed by your health care provider. Do I need screening for cancer? Depending on your health history and family history, you may need to have cancer screenings at different stages of your life. This may include screening for: Breast cancer. Cervical cancer. Lung cancer. Colorectal cancer. What is my risk for osteoporosis? After menopause, you may be at increased risk for osteoporosis. Osteoporosis is a condition in which bone destruction happens more quickly than new bone creation. To help prevent osteoporosis or the bone fractures that can happen because of osteoporosis, you may take the following actions: If you are 41-46 years old, get at least 1,000 mg of calcium and at least 600 international units (IU) of vitamin D per day. If you are older than age 65 but younger than age 22, get at least 1,200 mg of calcium and at least 600 international units (IU) of vitamin D per day. If you are older than age 61, get at least 1,200 mg of calcium and at least 800 international units (IU) of vitamin D per day. Smoking and drinking excessive alcohol increase the risk of osteoporosis. Eat foods that are rich in calcium and vitamin D, and do weight-bearing exercises several times each week as directed by your health care provider. How does menopause affect my mental health? Depression may occur at any age, but it is more common as you become older. Common symptoms of  depression include: Feeling depressed. Changes in sleep patterns. Changes in appetite or eating patterns. Feeling an overall lack of motivation or enjoyment of activities that you previously enjoyed. Frequent crying spells. Talk with your health care provider if you think that you are experiencing any of these symptoms. General instructions See your health care provider for regular wellness exams and vaccines. This may include: Scheduling regular health, dental, and eye exams. Getting and maintaining your vaccines. These include: Influenza vaccine. Get this vaccine each year before the flu season begins. Pneumonia vaccine. Shingles vaccine. Tetanus, diphtheria, and pertussis (Tdap) booster vaccine. Your health care provider may also recommend other immunizations. Tell your health care provider if you have ever been abused or do not feel safe at home. Summary Menopause is a normal process in which your ability to get pregnant comes to an end. This condition causes hot flashes, night sweats, decreased interest in sex, mood swings, headaches, or lack of sleep. Treatment for this condition may include hormone replacement therapy. Take actions to keep yourself healthy, including exercising regularly, eating a healthy diet, watching your weight, and checking your blood pressure and blood sugar levels. Get screened for cancer and depression. Make sure that you are up to date with all your vaccines. This information is not intended to replace advice given to you by your health care provider. Make sure you discuss any questions you have with your health care provider.  Document Revised: 07/09/2020 Document Reviewed: 07/09/2020 Elsevier Patient Education  2024 Elsevier Inc.   Exams We Offer: Cardiac CT Calcium Score  Knowing your score could save your life. A cardiac CT calcium score, also known as a coronary calcium scan, is a quick, convenient and noninvasive way of evaluating the amount of  calcified (hard) plaque in your heart vessels. The level of calcium equates to the extent of plaque build-up in your arteries. Plaque in the arteries can cause heart attacks.  The radiologist reads the images and sends your doctor a report with a calcium score. Patients with higher scores have a greater risk for a heart attack, heart disease or stroke. Knowing your score can help your doctor decide on blood pressure and cholesterol goals that will minimize your risk as much as possible.  The [employer] of Cardiology found that Coronary artery calcification (CAC) is an excellent cardiovascular disease risk marker and can help guide the decision to use cholesterol reducing medications such as statins. A negative calcium score may reduce the need for statins in otherwise eligible patients.  The exam takes less than 10 minutes, is painless and does not require any IV or oral contrast. At [facility] locations, the out-of-pocket fee without insurance is $75. At the time of scheduling, please let us know if you want to process the exam through your insurance or self-pay at the rate of $75. Patients who want to self-pay should not submit their insurance card when checking in for the appointment.   Who should get a Cardiac CT Calcium Score: Middle age adults at intermediate risk of heart disease Family history of heart disease Borderline high cholesterol, high blood pressure or diabetes Overweight or physical inactivity Uncertain about taking daily preventive medical therapy

## 2022-09-24 NOTE — Progress Notes (Signed)
Assessment & Plan:  Annual physical exam Assessment & Plan: Deferred clinical breast exam and pelvic exam as patient is currently following with GYN.  Congratulated patient on diligence to exercise.   Encounter for screening mammogram for malignant neoplasm of breast -     3D Screening Mammogram, Left and Right; Future  Family history of heart disease -     CT CARDIAC SCORING (SELF PAY ONLY); Future -     Hemoglobin A1c; Future -     Lipid panel; Future  Murmur Assessment & Plan: Faint SEM I/VI appreciated when supine only.  Asymptomatic.  Pending echocardiogram for further evaluation  Orders: -     ECHOCARDIOGRAM COMPLETE; Future -     CBC with Differential/Platelet; Future -     Comprehensive metabolic panel; Future -     TSH; Future  Asymptomatic postmenopausal state -     VITAMIN D 25 Hydroxy (Vit-D Deficiency, Fractures); Future  Encounter for lipid screening for cardiovascular disease -     Lipid panel; Future     Return precautions given.   Risks, benefits, and alternatives of the medications and treatment plan prescribed today were discussed, and patient expressed understanding.   Education regarding symptom management and diagnosis given to patient on AVS either electronically or printed.  Return in about 1 year (around 09/24/2023) for Fasting labs in 2-3 weeks, Complete Physical Exam.  Rennie Plowman, FNP  Subjective:    Patient ID: Deanna Young, female    DOB: October 14, 1965, 57 y.o.   MRN: [id number]  CC: Deanna Young is a 57 y.o. female who presents today for physical exam and transfer of care.    HPI: She has family history CAD; her mother passed in her 43's from MI.    She was told recently when she was sick 'she had murmur'.  This was concerning to her.  She is unaware of being diagnosed with heart murmur as a child.  She denies dizziness, fatigue, shortness of breath.  She exercises regularly.   Colorectal Cancer Screening: UTD , Dr  Johnny Bridge, repeat in 5 years 11/13/20  Breast Cancer Screening: Mammogram due next month Cervical Cancer Screening: 08/23/19 NILM, negative HPV; dr Ubaldo [facility] screening/DEXA for 65+: No increased fracture risk. Defer screening at this time.  Lung Cancer Screening: Doesn't have 20 year pack year history and age > 71 years yo 20 years         Tetanus - UTD         Exercise: Gets regular exercise playing tennis, pickle ball without cp, fatigue.    Alcohol use:  occassional Smoking/tobacco use: Nonsmoker.    Health Maintenance  Topic Date Due   HIV Screening  Never done   Hepatitis C Screening  Never done   Zoster (Shingles) Vaccine (1 of 2) Never done   COVID-19 Vaccine (3 - 2023-24 season) 11/01/2021   Flu Shot  10/02/2022   Mammogram  10/29/2023   Colon Cancer Screening  11/13/2025   DTaP/Tdap/Td vaccine (2 - Td or Tdap) 12/02/2028   HPV Vaccine  Aged Out   Pap Smear  Discontinued    ALLERGIES: Gramineae pollens  Current Outpatient Medications on File Prior to Visit  Medication Sig Dispense Refill   calcium-vitamin D 250-100 MG-UNIT tablet Take 1 tablet by mouth 2 (two) times daily.     estradiol (VIVELLE-DOT) 0.1 MG/24HR patch Place 1 patch (0.1 mg total) onto the skin 2 (two) times a week. 24 patch 3  No current facility-administered medications on file prior to visit.    Review of Systems  Constitutional:  Negative for chills, fever and unexpected weight change.  HENT:  Negative for congestion.   Respiratory:  Negative for cough.   Cardiovascular:  Negative for chest pain, palpitations and leg swelling.  Gastrointestinal:  Negative for nausea and vomiting.  Musculoskeletal:  Negative for arthralgias and myalgias.  Skin:  Negative for rash.  Neurological:  Negative for headaches.  Hematological:  Negative for adenopathy.  Psychiatric/Behavioral:  Negative for confusion.       Objective:    BP 124/76   Pulse 62   Temp 97.7 F (36.5 C) (Oral)   Ht  5\' 2"  (1.575 m)   Wt 143 lb (64.9 kg)   SpO2 98%   BMI 26.16 kg/m   BP Readings from Last 3 Encounters:  09/24/22 124/76  06/10/22 128/80  01/28/22 118/80   Wt Readings from Last 3 Encounters:  09/24/22 143 lb (64.9 kg)  06/10/22 146 lb (66.2 kg)  01/28/22 136 lb 3.2 oz (61.8 kg)    Physical Exam Vitals reviewed.  Constitutional:      Appearance: She is well-developed.  Eyes:     Conjunctiva/sclera: Conjunctivae normal.  Neck:     Vascular: No carotid bruit.  Cardiovascular:     Rate and Rhythm: Normal rate and regular rhythm.     Pulses: Normal pulses.     Heart sounds: Normal heart sounds.     Comments: Faint SEM I/VI appreciated when supine only.    non radiating, no thrill  Pulmonary:     Effort: Pulmonary effort is normal.     Breath sounds: Normal breath sounds. No wheezing, rhonchi or rales.  Skin:    General: Skin is warm and dry.  Neurological:     Mental Status: She is alert.  Psychiatric:        Speech: Speech normal.        Behavior: Behavior normal.        Thought Content: Thought content normal.

## 2022-09-24 NOTE — Assessment & Plan Note (Signed)
Faint SEM I/VI appreciated when supine only.  Asymptomatic.  Pending echocardiogram for further evaluation

## 2022-09-24 NOTE — Assessment & Plan Note (Signed)
Deferred clinical breast exam and pelvic exam as patient is currently following with GYN.  Congratulated patient on diligence to exercise.

## 2022-10-02 ENCOUNTER — Ambulatory Visit
Admission: RE | Admit: 2022-10-02 | Discharge: 2022-10-02 | Disposition: A | Discharge status: Home / Self Care | Payer: Self-pay | Source: Ambulatory Visit | Attending: Family | Admitting: Family

## 2022-10-02 DIAGNOSIS — Z8249 Family history of ischemic heart disease and other diseases of the circulatory system: Secondary | ICD-10-CM | POA: Insufficient documentation

## 2022-10-09 ENCOUNTER — Other Ambulatory Visit: Payer: BC Managed Care – PPO

## 2022-10-09 DIAGNOSIS — Z13220 Encounter for screening for lipoid disorders: Secondary | ICD-10-CM | POA: Diagnosis not present

## 2022-10-09 DIAGNOSIS — R011 Cardiac murmur, unspecified: Secondary | ICD-10-CM | POA: Diagnosis not present

## 2022-10-09 DIAGNOSIS — Z8249 Family history of ischemic heart disease and other diseases of the circulatory system: Secondary | ICD-10-CM

## 2022-10-09 DIAGNOSIS — Z136 Encounter for screening for cardiovascular disorders: Secondary | ICD-10-CM | POA: Diagnosis not present

## 2022-10-09 DIAGNOSIS — Z780 Asymptomatic menopausal state: Secondary | ICD-10-CM

## 2022-10-09 LAB — CBC WITH DIFFERENTIAL/PLATELET
Basophils Absolute: 0.1 10*3/uL (ref 0.0–0.1)
Basophils Relative: 1.2 % (ref 0.0–3.0)
Eosinophils Absolute: 0.2 10*3/uL (ref 0.0–0.7)
Eosinophils Relative: 3.6 % (ref 0.0–5.0)
HCT: 43.3 % (ref 36.0–46.0)
Hemoglobin: 14.1 g/dL (ref 12.0–15.0)
Lymphocytes Relative: 24.8 % (ref 12.0–46.0)
Lymphs Abs: 1.1 10*3/uL (ref 0.7–4.0)
MCHC: 32.6 g/dL (ref 30.0–36.0)
MCV: 96 fl (ref 78.0–100.0)
Monocytes Absolute: 0.3 10*3/uL (ref 0.1–1.0)
Monocytes Relative: 6.5 % (ref 3.0–12.0)
Neutro Abs: 2.9 10*3/uL (ref 1.4–7.7)
Neutrophils Relative %: 63.9 % (ref 43.0–77.0)
Platelets: 230 10*3/uL (ref 150.0–400.0)
RBC: 4.51 Mil/uL (ref 3.87–5.11)
RDW: 14.2 % (ref 11.5–15.5)
WBC: 4.5 10*3/uL (ref 4.0–10.5)

## 2022-10-09 LAB — LIPID PANEL
Cholesterol: 209 mg/dL — ABNORMAL HIGH (ref 0–200)
HDL: 82 mg/dL (ref >39.00)
LDL Cholesterol: 115 mg/dL — ABNORMAL HIGH (ref 0–99)
NonHDL: 126.53
Total CHOL/HDL Ratio: 3
Triglycerides: 59 mg/dL (ref 0.0–149.0)
VLDL: 11.8 mg/dL (ref 0.0–40.0)

## 2022-10-09 LAB — COMPREHENSIVE METABOLIC PANEL
ALT: 13 U/L (ref 0–35)
AST: 17 U/L (ref 0–37)
Albumin: 4.4 g/dL (ref 3.5–5.2)
Alkaline Phosphatase: 46 U/L (ref 39–117)
BUN: 13 mg/dL (ref 6–23)
CO2: 28 mEq/L (ref 19–32)
Calcium: 9.3 mg/dL (ref 8.4–10.5)
Chloride: 104 mEq/L (ref 96–112)
Creatinine, Ser: 0.65 mg/dL (ref 0.40–1.20)
GFR: 99.44 mL/min (ref >60.00)
Glucose, Bld: 83 mg/dL (ref 70–99)
Potassium: 4.4 mEq/L (ref 3.5–5.1)
Sodium: 138 mEq/L (ref 135–145)
Total Bilirubin: 0.4 mg/dL (ref 0.2–1.2)
Total Protein: 6.8 g/dL (ref 6.0–8.3)

## 2022-10-09 LAB — HEMOGLOBIN A1C: Hgb A1c MFr Bld: 4.9 % (ref 4.6–6.5)

## 2022-10-09 LAB — TSH: TSH: 4.59 u[IU]/mL (ref 0.35–5.50)

## 2022-10-09 LAB — VITAMIN D 25 HYDROXY (VIT D DEFICIENCY, FRACTURES): VITD: 32.29 ng/mL (ref 30.00–100.00)

## 2022-10-13 ENCOUNTER — Encounter: Payer: Self-pay | Admitting: Family

## 2022-10-15 ENCOUNTER — Ambulatory Visit
Admission: RE | Admit: 2022-10-15 | Discharge: 2022-10-15 | Disposition: A | Discharge status: Home / Self Care | Payer: BC Managed Care – PPO | Source: Ambulatory Visit | Attending: Family | Admitting: Family

## 2022-10-15 DIAGNOSIS — E785 Hyperlipidemia, unspecified: Secondary | ICD-10-CM | POA: Insufficient documentation

## 2022-10-15 DIAGNOSIS — R011 Cardiac murmur, unspecified: Secondary | ICD-10-CM | POA: Insufficient documentation

## 2022-10-15 LAB — ECHOCARDIOGRAM COMPLETE
AR max vel: 2.65 cm2
AV Area VTI: 2.74 cm2
AV Area mean vel: 2.46 cm2
AV Mean grad: 4 mmHg
AV Peak grad: 8.3 mmHg
Ao pk vel: 1.44 m/s
Area-P 1/2: 2.96 cm2
MV VTI: 2.22 cm2
S' Lateral: 2.6 cm

## 2022-10-15 NOTE — Progress Notes (Signed)
*  PRELIMINARY RESULTS* Echocardiogram 2D Echocardiogram has been performed.  Deanna Young 10/15/2022, 10:13 AM

## 2022-10-30 ENCOUNTER — Ambulatory Visit
Admission: RE | Admit: 2022-10-30 | Discharge: 2022-10-30 | Disposition: A | Discharge status: Home / Self Care | Payer: BC Managed Care – PPO | Source: Ambulatory Visit | Attending: Family | Admitting: Family

## 2022-10-30 DIAGNOSIS — Z1231 Encounter for screening mammogram for malignant neoplasm of breast: Secondary | ICD-10-CM | POA: Insufficient documentation

## 2022-11-21 ENCOUNTER — Telehealth: Payer: Self-pay | Admitting: Family

## 2022-11-21 NOTE — Telephone Encounter (Signed)
Deanna Young from [facility] patient accounting called stating they need a PA for an 8/14 appointment the pt had. Deanna stated they received a call from [facility] to do a retro with [place] [employer] before Deanna can do an appeal

## 2023-04-01 DIAGNOSIS — Z01419 Encounter for gynecological examination (general) (routine) without abnormal findings: Secondary | ICD-10-CM | POA: Diagnosis not present

## 2023-04-01 LAB — HM PAP SMEAR: HM Pap smear: NORMAL

## 2023-09-18 DIAGNOSIS — L814 Other melanin hyperpigmentation: Secondary | ICD-10-CM | POA: Diagnosis not present

## 2023-09-18 DIAGNOSIS — D2372 Other benign neoplasm of skin of left lower limb, including hip: Secondary | ICD-10-CM | POA: Diagnosis not present

## 2023-09-18 DIAGNOSIS — D2371 Other benign neoplasm of skin of right lower limb, including hip: Secondary | ICD-10-CM | POA: Diagnosis not present

## 2023-09-29 ENCOUNTER — Ambulatory Visit (INDEPENDENT_AMBULATORY_CARE_PROVIDER_SITE_OTHER): Payer: BC Managed Care – PPO | Admitting: Family

## 2023-09-29 ENCOUNTER — Encounter: Payer: Self-pay | Admitting: Family

## 2023-09-29 ENCOUNTER — Other Ambulatory Visit: Payer: Self-pay | Admitting: Obstetrics and Gynecology

## 2023-09-29 VITALS — BP 118/64 | HR 61 | Temp 98.0°F | Ht 62.0 in | Wt 146.6 lb

## 2023-09-29 DIAGNOSIS — Z1231 Encounter for screening mammogram for malignant neoplasm of breast: Secondary | ICD-10-CM

## 2023-09-29 DIAGNOSIS — Z0000 Encounter for general adult medical examination without abnormal findings: Secondary | ICD-10-CM | POA: Diagnosis not present

## 2023-09-29 LAB — COMPREHENSIVE METABOLIC PANEL WITH GFR
ALT: 12 U/L (ref 0–35)
AST: 16 U/L (ref 0–37)
Albumin: 4.4 g/dL (ref 3.5–5.2)
Alkaline Phosphatase: 37 U/L — ABNORMAL LOW (ref 39–117)
BUN: 17 mg/dL (ref 6–23)
CO2: 27 meq/L (ref 19–32)
Calcium: 9.4 mg/dL (ref 8.4–10.5)
Chloride: 103 meq/L (ref 96–112)
Creatinine, Ser: 0.66 mg/dL (ref 0.40–1.20)
GFR: 98.4 mL/min (ref >60.00)
Glucose, Bld: 71 mg/dL (ref 70–99)
Potassium: 4.2 meq/L (ref 3.5–5.1)
Sodium: 139 meq/L (ref 135–145)
Total Bilirubin: 0.5 mg/dL (ref 0.2–1.2)
Total Protein: 6.6 g/dL (ref 6.0–8.3)

## 2023-09-29 LAB — CBC WITH DIFFERENTIAL/PLATELET
Basophils Absolute: 0 K/uL (ref 0.0–0.1)
Basophils Relative: 1.2 % (ref 0.0–3.0)
Eosinophils Absolute: 0.3 K/uL (ref 0.0–0.7)
Eosinophils Relative: 7.6 % — ABNORMAL HIGH (ref 0.0–5.0)
HCT: 42.1 % (ref 36.0–46.0)
Hemoglobin: 13.7 g/dL (ref 12.0–15.0)
Lymphocytes Relative: 26.5 % (ref 12.0–46.0)
Lymphs Abs: 1.1 K/uL (ref 0.7–4.0)
MCHC: 32.6 g/dL (ref 30.0–36.0)
MCV: 93.9 fl (ref 78.0–100.0)
Monocytes Absolute: 0.3 K/uL (ref 0.1–1.0)
Monocytes Relative: 6.9 % (ref 3.0–12.0)
Neutro Abs: 2.4 K/uL (ref 1.4–7.7)
Neutrophils Relative %: 57.8 % (ref 43.0–77.0)
Platelets: 207 K/uL (ref 150.0–400.0)
RBC: 4.48 Mil/uL (ref 3.87–5.11)
RDW: 14.2 % (ref 11.5–15.5)
WBC: 4.1 K/uL (ref 4.0–10.5)

## 2023-09-29 LAB — LIPID PANEL
Cholesterol: 201 mg/dL — ABNORMAL HIGH (ref 0–200)
HDL: 78.3 mg/dL (ref >39.00)
LDL Cholesterol: 105 mg/dL — ABNORMAL HIGH (ref 0–99)
NonHDL: 122.45
Total CHOL/HDL Ratio: 3
Triglycerides: 88 mg/dL (ref 0.0–149.0)
VLDL: 17.6 mg/dL (ref 0.0–40.0)

## 2023-09-29 LAB — VITAMIN D 25 HYDROXY (VIT D DEFICIENCY, FRACTURES): VITD: 36.94 ng/mL (ref 30.00–100.00)

## 2023-09-29 LAB — TSH: TSH: 4.11 u[IU]/mL (ref 0.35–5.50)

## 2023-09-29 LAB — HEMOGLOBIN A1C: Hgb A1c MFr Bld: 5.1 % (ref 4.6–6.5)

## 2023-09-29 NOTE — Patient Instructions (Signed)
 Health Maintenance for Postmenopausal Women Menopause is a normal process in which your ability to get pregnant comes to an end. This process happens slowly over many months or years, usually between the ages of 24 and 62. Menopause is complete when you have missed your menstrual period for 12 months. It is important to talk with your health care provider about some of the most common conditions that affect women after menopause (postmenopausal women). These include heart disease, cancer, and bone loss (osteoporosis). Adopting a healthy lifestyle and getting preventive care can help to promote your health and wellness. The actions you take can also lower your chances of developing some of these common conditions. What are the signs and symptoms of menopause? During menopause, you may have the following symptoms: Hot flashes. These can be moderate or severe. Night sweats. Decrease in sex drive. Mood swings. Headaches. Tiredness (fatigue). Irritability. Memory problems. Problems falling asleep or staying asleep. Talk with your health care provider about treatment options for your symptoms. Do I need hormone replacement therapy? Hormone replacement therapy is effective in treating symptoms that are caused by menopause, such as hot flashes and night sweats. Hormone replacement carries certain risks, especially as you become older. If you are thinking about using estrogen or estrogen with progestin, discuss the benefits and risks with your health care provider. How can I reduce my risk for heart disease and stroke? The risk of heart disease, heart attack, and stroke increases as you age. One of the causes may be a change in the body's hormones during menopause. This can affect how your body uses dietary fats, triglycerides, and cholesterol. Heart attack and stroke are medical emergencies. There are many things that you can do to help prevent heart disease and stroke. Watch your blood pressure High  blood pressure causes heart disease and increases the risk of stroke. This is more likely to develop in people who have high blood pressure readings or are overweight. Have your blood pressure checked: Every 3-5 years if you are 50-75 years of age. Every year if you are 77 years old or older. Eat a healthy diet  Eat a diet that includes plenty of vegetables, fruits, low-fat dairy products, and lean protein. Do not eat a lot of foods that are high in solid fats, added sugars, or sodium. Get regular exercise Get regular exercise. This is one of the most important things you can do for your health. Most adults should: Try to exercise for at least 150 minutes each week. The exercise should increase your heart rate and make you sweat (moderate-intensity exercise). Try to do strengthening exercises at least twice each week. Do these in addition to the moderate-intensity exercise. Spend less time sitting. Even light physical activity can be beneficial. Other tips Work with your health care provider to achieve or maintain a healthy weight. Do not use any products that contain nicotine or tobacco. These products include cigarettes, chewing tobacco, and vaping devices, such as e-cigarettes. If you need help quitting, ask your health care provider. Know your numbers. Ask your health care provider to check your cholesterol and your blood sugar (glucose). Continue to have your blood tested as directed by your health care provider. Do I need screening for cancer? Depending on your health history and family history, you may need to have cancer screenings at different stages of your life. This may include screening for: Breast cancer. Cervical cancer. Lung cancer. Colorectal cancer. What is my risk for osteoporosis? After menopause, you may be  at increased risk for osteoporosis. Osteoporosis is a condition in which bone destruction happens more quickly than new bone creation. To help prevent osteoporosis or  the bone fractures that can happen because of osteoporosis, you may take the following actions: If you are 61-3 years old, get at least 1,000 mg of calcium and at least 600 international units (IU) of vitamin D per day. If you are older than age 61 but younger than age 75, get at least 1,200 mg of calcium and at least 600 international units (IU) of vitamin D per day. If you are older than age 62, get at least 1,200 mg of calcium and at least 800 international units (IU) of vitamin D per day. Smoking and drinking excessive alcohol increase the risk of osteoporosis. Eat foods that are rich in calcium and vitamin D, and do weight-bearing exercises several times each week as directed by your health care provider. How does menopause affect my mental health? Depression may occur at any age, but it is more common as you become older. Common symptoms of depression include: Feeling depressed. Changes in sleep patterns. Changes in appetite or eating patterns. Feeling an overall lack of motivation or enjoyment of activities that you previously enjoyed. Frequent crying spells. Talk with your health care provider if you think that you are experiencing any of these symptoms. General instructions See your health care provider for regular wellness exams and vaccines. This may include: Scheduling regular health, dental, and eye exams. Getting and maintaining your vaccines. These include: Influenza vaccine. Get this vaccine each year before the flu season begins. Pneumonia vaccine. Shingles vaccine. Tetanus, diphtheria, and pertussis (Tdap) booster vaccine. Your health care provider may also recommend other immunizations. Tell your health care provider if you have ever been abused or do not feel safe at home. Summary Menopause is a normal process in which your ability to get pregnant comes to an end. This condition causes hot flashes, night sweats, decreased interest in sex, mood swings, headaches, or lack  of sleep. Treatment for this condition may include hormone replacement therapy. Take actions to keep yourself healthy, including exercising regularly, eating a healthy diet, watching your weight, and checking your blood pressure and blood sugar levels. Get screened for cancer and depression. Make sure that you are up to date with all your vaccines. This information is not intended to replace advice given to you by your health care provider. Make sure you discuss any questions you have with your health care provider. Document Revised: 07/09/2020 Document Reviewed: 07/09/2020 Elsevier Patient Education  2024 [employer].

## 2023-09-29 NOTE — Assessment & Plan Note (Signed)
 Deferred CBE due to patient preferred. She is following with GYN. Pap smear is UTD.  Mammogram scheduled  She will consider Shingrex vaccine in the future; declines PCV20.

## 2023-09-29 NOTE — Progress Notes (Signed)
 Assessment & Plan:  Routine physical examination -     VITAMIN D  25 Hydroxy (Vit-D Deficiency, Fractures) -     Hemoglobin A1c -     CBC with Differential/Platelet -     Comprehensive metabolic panel with GFR -     Lipid panel -     TSH  Annual physical exam Assessment & Plan: Deferred CBE due to patient preferred. She is following with GYN. Pap smear is UTD.  Mammogram scheduled  She will consider Shingrex vaccine in the future; declines PCV20.       Return precautions given.   Risks, benefits, and alternatives of the medications and treatment plan prescribed today were discussed, and patient expressed understanding.   Education regarding symptom management and diagnosis given to patient on AVS either electronically or printed.  No follow-ups on file.  Rollene Northern, FNP  Subjective:    Patient ID: Deanna Young, female    DOB: 1965-11-16, 58 y.o.   MRN: [id number]  CC: Deanna Young is a 58 y.o. female who presents today for physical exam.    HPI: Feels well today No new complaints      Colorectal Cancer Screening: UTD 11/2020 , Dr leigh, repeat in 5 years Breast Cancer Screening: Mammogram scheduled; following with Dr verdon Cervical Cancer Screening: Following with Dr verdon 04/01/23 negative malignancy,neg HPV ; h/o hysterectomy; she cervix is intact  Bone Health screening/DEXA for 65+: No increased fracture risk. Defer screening at this time.  Lung Cancer Screening: Doesn't have 20 year pack year history and age > 51 years yo 82 years        Tetanus - UTD        Pneumococcal - Candidate for. declines  Exercise: Gets regular exercise golf,  tennis , and pickle ball.  Alcohol use:  occasional Smoking/tobacco use: Nonsmoker.    Health Maintenance  Topic Date Due   Zoster (Shingles) Vaccine (1 of 2) Never done   Pap with HPV screening  08/08/2019   COVID-19 Vaccine (5 - 2024-25 season) 11/02/2022   Flu Shot  10/02/2023   Mammogram   10/29/2024   Colon Cancer Screening  11/13/2025   DTaP/Tdap/Td vaccine (2 - Td or Tdap) 12/02/2028   HPV Vaccine  Aged Out   Meningitis B Vaccine  Aged Out   Hepatitis B Vaccine  Discontinued   Hepatitis C Screening  Discontinued   HIV Screening  Discontinued    ALLERGIES: Gramineae pollens  Current Outpatient Medications on File Prior to Visit  Medication Sig Dispense Refill   calcium-vitamin D  250-100 MG-UNIT tablet Take 1 tablet by mouth 2 (two) times daily.     estradiol  (VIVELLE -DOT) 0.1 MG/24HR patch Place 1 patch (0.1 mg total) onto the skin 2 (two) times a week. 24 patch 3   No current facility-administered medications on file prior to visit.    Review of Systems  Constitutional:  Negative for chills, fever and unexpected weight change.  HENT:  Negative for congestion.   Respiratory:  Negative for cough.   Cardiovascular:  Negative for chest pain, palpitations and leg swelling.  Gastrointestinal:  Negative for nausea and vomiting.  Musculoskeletal:  Negative for arthralgias and myalgias.  Skin:  Negative for rash.  Neurological:  Negative for headaches.  Hematological:  Negative for adenopathy.  Psychiatric/Behavioral:  Negative for confusion.       Objective:    BP 118/64 (BP Location: Left Arm, Patient Position: Sitting, Cuff Size: Normal)   Pulse 61  Temp 98 F (36.7 C) (Oral)   Ht 5' 2 (1.575 m)   Wt 146 lb 9.6 oz (66.5 kg)   SpO2 99%   BMI 26.81 kg/m   BP Readings from Last 3 Encounters:  09/29/23 118/64  09/24/22 124/76  06/10/22 128/80   Wt Readings from Last 3 Encounters:  09/29/23 146 lb 9.6 oz (66.5 kg)  09/24/22 143 lb (64.9 kg)  06/10/22 146 lb (66.2 kg)    Physical Exam Vitals reviewed.  Constitutional:      Appearance: Normal appearance. She is well-developed.  Eyes:     Conjunctiva/sclera: Conjunctivae normal.  Neck:     Thyroid : No thyroid  mass or thyromegaly.  Cardiovascular:     Rate and Rhythm: Normal rate and regular  rhythm.     Pulses: Normal pulses.     Heart sounds: Normal heart sounds.  Pulmonary:     Effort: Pulmonary effort is normal.     Breath sounds: Normal breath sounds. No wheezing, rhonchi or rales.  Abdominal:     General: Bowel sounds are normal. There is no distension.     Palpations: Abdomen is soft. Abdomen is not rigid. There is no fluid wave or mass.     Tenderness: There is no abdominal tenderness. There is no guarding or rebound.  Lymphadenopathy:     Head:     Right side of head: No submental, submandibular, tonsillar, preauricular, posterior auricular or occipital adenopathy.     Left side of head: No submental, submandibular, tonsillar, preauricular, posterior auricular or occipital adenopathy.     Cervical: No cervical adenopathy.  Skin:    General: Skin is warm and dry.  Neurological:     Mental Status: She is alert.  Psychiatric:        Speech: Speech normal.        Behavior: Behavior normal.        Thought Content: Thought content normal.

## 2023-10-01 ENCOUNTER — Ambulatory Visit: Payer: Self-pay | Admitting: Family

## 2023-10-01 DIAGNOSIS — Z0000 Encounter for general adult medical examination without abnormal findings: Secondary | ICD-10-CM

## 2023-11-03 ENCOUNTER — Ambulatory Visit
Admission: RE | Admit: 2023-11-03 | Discharge: 2023-11-03 | Disposition: A | Discharge status: Home / Self Care | Source: Ambulatory Visit | Attending: Obstetrics and Gynecology | Admitting: Obstetrics and Gynecology

## 2023-11-03 DIAGNOSIS — Z1231 Encounter for screening mammogram for malignant neoplasm of breast: Secondary | ICD-10-CM | POA: Insufficient documentation

## 2023-11-12 ENCOUNTER — Other Ambulatory Visit (INDEPENDENT_AMBULATORY_CARE_PROVIDER_SITE_OTHER)

## 2023-11-12 DIAGNOSIS — Z0000 Encounter for general adult medical examination without abnormal findings: Secondary | ICD-10-CM

## 2023-11-12 LAB — CBC WITH DIFFERENTIAL/PLATELET
Basophils Absolute: 0.1 K/uL (ref 0.0–0.1)
Basophils Relative: 1 % (ref 0.0–3.0)
Eosinophils Absolute: 0.1 K/uL (ref 0.0–0.7)
Eosinophils Relative: 2.7 % (ref 0.0–5.0)
HCT: 40.9 % (ref 36.0–46.0)
Hemoglobin: 13.5 g/dL (ref 12.0–15.0)
Lymphocytes Relative: 22.8 % (ref 12.0–46.0)
Lymphs Abs: 1.3 K/uL (ref 0.7–4.0)
MCHC: 33 g/dL (ref 30.0–36.0)
MCV: 92.5 fl (ref 78.0–100.0)
Monocytes Absolute: 0.3 K/uL (ref 0.1–1.0)
Monocytes Relative: 5.3 % (ref 3.0–12.0)
Neutro Abs: 3.8 K/uL (ref 1.4–7.7)
Neutrophils Relative %: 68.2 % (ref 43.0–77.0)
Platelets: 224 K/uL (ref 150.0–400.0)
RBC: 4.42 Mil/uL (ref 3.87–5.11)
RDW: 14.1 % (ref 11.5–15.5)
WBC: 5.6 K/uL (ref 4.0–10.5)

## 2023-11-13 ENCOUNTER — Ambulatory Visit: Payer: Self-pay | Admitting: Family

## 2024-01-20 DIAGNOSIS — M9901 Segmental and somatic dysfunction of cervical region: Secondary | ICD-10-CM | POA: Diagnosis not present

## 2024-01-20 DIAGNOSIS — M9902 Segmental and somatic dysfunction of thoracic region: Secondary | ICD-10-CM | POA: Diagnosis not present

## 2024-01-20 DIAGNOSIS — M9903 Segmental and somatic dysfunction of lumbar region: Secondary | ICD-10-CM | POA: Diagnosis not present

## 2024-01-20 DIAGNOSIS — M62830 Muscle spasm of back: Secondary | ICD-10-CM | POA: Diagnosis not present

## 2024-01-22 DIAGNOSIS — M546 Pain in thoracic spine: Secondary | ICD-10-CM | POA: Diagnosis not present

## 2024-01-22 DIAGNOSIS — M9902 Segmental and somatic dysfunction of thoracic region: Secondary | ICD-10-CM | POA: Diagnosis not present

## 2024-01-22 DIAGNOSIS — M542 Cervicalgia: Secondary | ICD-10-CM | POA: Diagnosis not present

## 2024-01-22 DIAGNOSIS — M9901 Segmental and somatic dysfunction of cervical region: Secondary | ICD-10-CM | POA: Diagnosis not present

## 2024-02-12 DIAGNOSIS — M9901 Segmental and somatic dysfunction of cervical region: Secondary | ICD-10-CM | POA: Diagnosis not present

## 2024-02-12 DIAGNOSIS — M9902 Segmental and somatic dysfunction of thoracic region: Secondary | ICD-10-CM | POA: Diagnosis not present

## 2024-02-12 DIAGNOSIS — M542 Cervicalgia: Secondary | ICD-10-CM | POA: Diagnosis not present

## 2024-02-12 DIAGNOSIS — M546 Pain in thoracic spine: Secondary | ICD-10-CM | POA: Diagnosis not present

## 2024-09-30 ENCOUNTER — Encounter: Admitting: Family
